# Patient Record
Sex: Male | Born: 1975 | Race: Black or African American | Hispanic: No
Health system: Midwestern US, Community
[De-identification: ages and names within clinical notes are randomized; demographics above are authoritative.]

## PROBLEM LIST (undated history)

## (undated) DIAGNOSIS — G8929 Other chronic pain: Secondary | ICD-10-CM

## (undated) DIAGNOSIS — M545 Low back pain: Secondary | ICD-10-CM

## (undated) DIAGNOSIS — F322 Major depressive disorder, single episode, severe without psychotic features: Secondary | ICD-10-CM

## (undated) DIAGNOSIS — I10 Essential (primary) hypertension: Secondary | ICD-10-CM

## (undated) DIAGNOSIS — M79604 Pain in right leg: Secondary | ICD-10-CM

## (undated) DIAGNOSIS — F419 Anxiety disorder, unspecified: Secondary | ICD-10-CM

## (undated) DIAGNOSIS — M79605 Pain in left leg: Secondary | ICD-10-CM

## (undated) DIAGNOSIS — G4700 Insomnia, unspecified: Secondary | ICD-10-CM

## (undated) DIAGNOSIS — M549 Dorsalgia, unspecified: Secondary | ICD-10-CM

## (undated) DIAGNOSIS — M1990 Unspecified osteoarthritis, unspecified site: Secondary | ICD-10-CM

## (undated) HISTORY — DX: Essential (primary) hypertension: I10

## (undated) HISTORY — PX: KNEE SURGERY: SHX244

## (undated) HISTORY — DX: Dorsalgia, unspecified: M54.9

## (undated) HISTORY — DX: Major depressive disorder, single episode, severe without psychotic features: F32.2

## (undated) HISTORY — DX: Other chronic pain: G89.29

## (undated) HISTORY — DX: Insomnia, unspecified: G47.00

## (undated) HISTORY — PX: BACK SURGERY: SHX140

## (undated) HISTORY — DX: Anxiety disorder, unspecified: F41.9

---

## 2016-04-04 ENCOUNTER — Encounter

## 2016-04-04 ENCOUNTER — Inpatient Hospital Stay: Admit: 2016-04-04 | Payer: MEDICARE | Primary: Family Medicine

## 2016-04-04 ENCOUNTER — Ambulatory Visit: Payer: MEDICARE | Attending: Neurological Surgery | Primary: Family Medicine

## 2016-04-04 DIAGNOSIS — M5440 Lumbago with sciatica, unspecified side: Secondary | ICD-10-CM

## 2016-04-04 NOTE — Progress Notes (Unsigned)
Neurosurgery Consult     Subjective:              Justin Barker is a 40 y.o. {race/ethnicity:17218} {rt/lf/ambi dominance:15932::"right handed"} male who presents with       Patient Active Problem List    Diagnosis Date Noted   ??? Hypertension          Phys Other, MD       Past Medical History:   Diagnosis Date   ??? Hypertension            Past Surgical History:   Procedure Laterality Date   ??? HX LUMBAR LAMINECTOMY      x4          Allergies   Allergen Reactions   ??? Trazodone Unknown (comments)           Family History   Problem Relation Age of Onset   ??? Hypertension Mother    ??? Hypertension Father    ??? Diabetes Father    ??? Heart Disease Father         Current Outpatient Prescriptions   Medication Sig   ??? metoprolol succinate (TOPROL-XL) 25 mg XL tablet TK 1 T PO  QD   ??? HYDROcodone-acetaminophen (NORCO) 10-325 mg tablet Take 1 Tab by mouth.   ??? diazePAM (VALIUM) 10 mg tablet Take 10 mg by mouth every six (6) hours as needed for Anxiety.     No current facility-administered medications for this visit.          Review of Systems: {Ros - complete:30496}      Objective:     BP 136/90   Temp 97.9 ??F (36.6 ??C)   Ht 5\' 11"  (1.803 m)   Wt 310 lb (140.6 kg)   BMI 43.24 kg/m2    Physical Exam:   General:  Alert, cooperative, no distress, appears stated age.   Eyes:  Conjunctivae/corneas clear. PERRL, EOMs intact. Fundi benign   Ears:  Normal TMs and external ear canals both ears.   Nose: Nares normal. Septum midline. Mucosa normal. No drainage or sinus tenderness.   Mouth/Throat: Lips, mucosa, and tongue normal. Teeth and gums normal.   Neck: Supple, symmetrical, trachea midline, no adenopathy, thyroid: no enlargment/tenderness/nodules, no carotid bruit and no JVD.   Back:   Symmetric, no curvature. ROM normal. No CVA tenderness.   Lungs:   Clear to auscultation bilaterally.   Heart:  Regular rate and rhythm, S1, S2 normal, no murmur, click, rub or gallop.   Abdomen:   Soft, non-tender. Bowel sounds normal. No masses,  No  organomegaly.   Extremities: Extremities normal, atraumatic, no cyanosis or edema.   Pulses: 2+ and symmetric all extremities.   Skin: Skin color, texture, turgor normal. No rashes or lesions   Lymph nodes: Cervical, supraclavicular, and axillary nodes normal.   Appearance:  The patient is well developed, well nourished, provides a coherent history and is in no acute distress.        GCS15, A&Ox3. Speech is fluent and appropriate.   Cranial Nerves:   Intact visual fields. Fundi are benign. PERLA, EOM's full, no nystagmus, no ptosis. Facial sensation is normal. Corneal reflexes are intact. Facial movement is symmetric. Hearing is normal bilaterally. Palate is midline with normal sternocleidomastoid and trapezius muscles are normal. Tongue is midline.   Motor:  5/5 strength in upper and lower proximal and distal muscles. Normal bulk and tone. No fasciculations.   Reflexes:   Deep tendon reflexes 2+/4 and symmetrical.  Hoffman's sign is negative bilaterally.  There is no clonus present.   Sensory:   Normal to touch, pinprick and vibration.   Gait:  Normal gait.   Tremor:   No tremor noted.   Cerebellar:  No cerebellar signs present.  Romberg's sign is negative.  Tandem gait is normal.       Assessment:     ***    Plan:         I have spent more that 50% of this *** minute visit discussing extensively with the patient various treatment options, alternative treatments, diagnostic studies and explained the findings of x-rays, MRI's, CT scans and other data such as EMG's prior notes, operative reports, etc.    {No Diagnosis Found}        A user error has taken place: encounter opened in error, closed for administrative reasons.

## 2016-06-01 ENCOUNTER — Other Ambulatory Visit: Payer: Self-pay | Admitting: Orthopedic Surgery

## 2016-06-03 ENCOUNTER — Encounter: Payer: Self-pay | Admitting: Vascular Surgery

## 2016-06-07 ENCOUNTER — Encounter: Attending: Neurological Surgery | Primary: Family Medicine

## 2016-06-07 ENCOUNTER — Encounter: Payer: Self-pay | Admitting: Vascular Surgery

## 2016-06-08 ENCOUNTER — Other Ambulatory Visit: Payer: Self-pay

## 2016-06-15 ENCOUNTER — Encounter: Payer: Medicare HMO | Admitting: Vascular Surgery

## 2016-06-20 NOTE — Pre-Procedure Instructions (Addendum)
     Karma GreaserJarvis Decaire  06/20/2016      [facility] [place] [address] [place] [address] Phone: [phone] Fax: [phone]    Your procedure is scheduled on Wednesday, June 29, 2016   [facility] Admitting at 6:30 AM             (posted surgery time 8:30 am - 1:35 pm)   Call this number if you have problems [facility]:  [phone], or during the weekdays, from 8-4:30 pm [phone]   Remember:  Do not eat food or drink liquids after midnight Tuesday June 28, 2016   Take these medicines the morning of surgery with A SIP OF WATER : Amlodipine, Clonidine, Metoprolol,  if needed: Hydrocodone for pain   Stop taking Aspirin, vitamins, fish oil, and herbal medications. Do not take any NSAIDs ie: Ibuprofen, Advil, Naproxen, BC and Goody Powder or any medication containing Aspirin;stop Wednesday Jun 22, 2016  Do not wear jewelry - no rings or watches.  Do not wear lotions, colognes or deoderant.             Men may shave face and neck.   Do not bring valuables to the hospital.  [facility] is not [facility] for any belongings or valuables.  Contacts, dentures or bridgework may not be worn into surgery.  Leave your suitcase in the car.  After surgery it may be brought to your room. For patients admitted to the hospital, discharge time will be determined by your treatment team. Please read over the following fact sheets that you were given. Pain Booklet, Coughing and Deep Breathing, Blood Transfusion Information, MRSA Information and Surgical Site Infection Prevention

## 2016-06-21 ENCOUNTER — Encounter (HOSPITAL_COMMUNITY)
Admission: RE | Admit: 2016-06-21 | Discharge: 2016-06-21 | Disposition: A | Discharge status: Home / Self Care | Payer: Medicare HMO | Source: Ambulatory Visit | Attending: Orthopedic Surgery | Admitting: Orthopedic Surgery

## 2016-06-21 ENCOUNTER — Encounter: Payer: Self-pay | Admitting: Vascular Surgery

## 2016-06-21 ENCOUNTER — Encounter (HOSPITAL_COMMUNITY): Payer: Self-pay

## 2016-06-21 DIAGNOSIS — Z01818 Encounter for other preprocedural examination: Secondary | ICD-10-CM | POA: Insufficient documentation

## 2016-06-21 HISTORY — DX: Unspecified osteoarthritis, unspecified site: M19.90

## 2016-06-21 HISTORY — DX: Pain in right leg: M79.604

## 2016-06-21 HISTORY — DX: Pain in left leg: M79.605

## 2016-06-21 LAB — ABO/RH: ABO/RH(D): A POS

## 2016-06-21 LAB — URINALYSIS, ROUTINE W REFLEX MICROSCOPIC
BILIRUBIN URINE: NEGATIVE
GLUCOSE, UA: NEGATIVE mg/dL
HGB URINE DIPSTICK: NEGATIVE
Ketones, ur: NEGATIVE mg/dL
Leukocytes, UA: NEGATIVE
Nitrite: NEGATIVE
PROTEIN: NEGATIVE mg/dL
SPECIFIC GRAVITY, URINE: 1.023 (ref 1.005–1.030)
pH: 5 (ref 5.0–8.0)

## 2016-06-21 LAB — CBC WITH DIFFERENTIAL/PLATELET
BASOS PCT: 0 %
Basophils Absolute: 0 10*3/uL (ref 0.0–0.1)
EOS ABS: 0.2 10*3/uL (ref 0.0–0.7)
EOS PCT: 3 %
HCT: 40.7 % (ref 39.0–52.0)
Hemoglobin: 13 g/dL (ref 13.0–17.0)
LYMPHS ABS: 3 10*3/uL (ref 0.7–4.0)
Lymphocytes Relative: 31 %
MCH: 27 pg (ref 26.0–34.0)
MCHC: 31.9 g/dL (ref 30.0–36.0)
MCV: 84.4 fL (ref 78.0–100.0)
MONO ABS: 0.6 10*3/uL (ref 0.1–1.0)
MONOS PCT: 6 %
NEUTROS PCT: 60 %
Neutro Abs: 5.9 10*3/uL (ref 1.7–7.7)
PLATELETS: 279 10*3/uL (ref 150–400)
RBC: 4.82 MIL/uL (ref 4.22–5.81)
RDW: 14.4 % (ref 11.5–15.5)
WBC: 9.7 10*3/uL (ref 4.0–10.5)

## 2016-06-21 LAB — COMPREHENSIVE METABOLIC PANEL
ALBUMIN: 3.8 g/dL (ref 3.5–5.0)
ALT: 18 U/L (ref 17–63)
ANION GAP: 7 (ref 5–15)
AST: 18 U/L (ref 15–41)
Alkaline Phosphatase: 51 U/L (ref 38–126)
BUN: 13 mg/dL (ref 6–20)
CALCIUM: 9.4 mg/dL (ref 8.9–10.3)
CO2: 27 mmol/L (ref 22–32)
Chloride: 105 mmol/L (ref 101–111)
Creatinine, Ser: 1.3 mg/dL — ABNORMAL HIGH (ref 0.61–1.24)
GFR calc non Af Amer: >60 mL/min (ref >60)
GLUCOSE: 95 mg/dL (ref 65–99)
Potassium: 4.3 mmol/L (ref 3.5–5.1)
SODIUM: 139 mmol/L (ref 135–145)
Total Bilirubin: 1.2 mg/dL (ref 0.3–1.2)
Total Protein: 7.9 g/dL (ref 6.5–8.1)

## 2016-06-21 LAB — SURGICAL PCR SCREEN
MRSA, PCR: NEGATIVE
Staphylococcus aureus: POSITIVE — AB

## 2016-06-21 LAB — TYPE AND SCREEN
ABO/RH(D): A POS
ANTIBODY SCREEN: NEGATIVE

## 2016-06-21 LAB — APTT: aPTT: 46 seconds — ABNORMAL HIGH (ref 24–36)

## 2016-06-21 LAB — PROTIME-INR
INR: 1.07
Prothrombin Time: 13.9 seconds (ref 11.4–15.2)

## 2016-06-21 NOTE — Progress Notes (Signed)
Pt stated that he did not take blood pressure medications as prescribed this morning. Edmonia CaprioAllison, Z., PA, Anesthesia, made aware of pt elevated BP, C/O pain and not taking BP medications this AM; pt educated on the importance of taking medications as prescribed, advised to take BP medications ASAP and to make sure that he takes them the morning of procedure. Pt verbalized understanding.

## 2016-06-21 NOTE — Progress Notes (Signed)
Pt denies SOB, chest pain, and being under the care of a cardiologist. Pt denies having a stress test, echo and cardiac cath. Pt denies having an EKG and chest x ray within the last year. Pt denies having recent labs. Pt chart forwardd to anesthesia to review abnormal PTT.

## 2016-06-22 NOTE — Progress Notes (Signed)
Anesthesia Chart Review:  Pt is a 41 year old male scheduled for L4-5, L5-S1 anterior lumbar interbody fusion on 06/29/2016 with Estill BambergMark Dumonski, MD and Gretta Beganodd Early, M.D.  - PCP is Caffie DammeFrank Moyer, MD (notes in care everywhere)  BP at PAT was 150/97 and 141/100 on recheck. Pt reports he did not take antihypertensive that morning. Patient instructed to take antihypertensive medication as prescribed.  PMH includes: HTN. Never smoker. BMI 43.5.  Medications include: Amlodipine, clonidine, metoprolol.  Preoperative labs reviewed.  PT normal, PTT 46.  - I notified Carla in Dr. Marshell Levanumonski's office of elevated PTT.   CXR 06/21/16: There is no active cardiopulmonary disease.  EKG 06/21/16: NSR  Rica Mastngela Jillianna Stanek, FNP-BC [facility] Phone: [phone] 06/22/2016 3:40 PM

## 2016-06-28 ENCOUNTER — Ambulatory Visit (INDEPENDENT_AMBULATORY_CARE_PROVIDER_SITE_OTHER): Payer: Medicare HMO | Admitting: Vascular Surgery

## 2016-06-28 ENCOUNTER — Encounter: Payer: Self-pay | Admitting: Vascular Surgery

## 2016-06-28 ENCOUNTER — Encounter: Payer: Medicare HMO | Admitting: Vascular Surgery

## 2016-06-28 VITALS — BP 129/88 | HR 75 | Temp 97.2°F | Resp 16 | Ht 71.0 in | Wt 314.0 lb

## 2016-06-28 DIAGNOSIS — M5137 Other intervertebral disc degeneration, lumbosacral region: Secondary | ICD-10-CM

## 2016-06-28 NOTE — Progress Notes (Signed)
[facility] [place]  Patient name: [name] MRN: [id number] DOB: 03-22-1976 Sex: male  REASON FOR CONSULT: Preop discussion for ALIF  HPI: [name] is a 41 y.o. male, who is seen today for discussion of the anterior exposure for disc fusion. Is very pleasant 41 year old gentleman with a history of 4 prior back surgeries. He has had recurrent disease and now is being considered for additional surgery. Has had recommendation for anterior interbody fusion of L4-5 and L5-S1. He is here today for discussion of my role in exposure. He has had no prior intra-abdominal surgery. Has no history of cardiac disease. He has severe symptoms of both back pain and leg discomfort most particularly left leg weakness.  Past Medical History:  Diagnosis Date  . Anxiety   . Arthritis   . Bilateral leg pain   . Chronic back pain   . Hypertension   . Insomnia   . Insomnia   . Severe major depression, single episode (HCC)     Family History  Problem Relation Age of Onset  . Hypertension Father   . Diabetes Father   . Hypertension Brother   . Diabetes Brother   . Hypertension Brother     SOCIAL HISTORY: Social History   Social History  . Marital status: Divorced    Spouse name: N/A  . Number of children: N/A  . Years of education: N/A   Occupational History  . Not on file.   Social History Main Topics  . Smoking status: Never Smoker  . Smokeless tobacco: Never Used  . Alcohol use No     Comment: rare  . Drug use: No  . Sexual activity: Not on file   Other Topics Concern  . Not on file   Social History Narrative  . No narrative on file    Allergies  Allergen Reactions  . Trazodone And Nefazodone Other (See Comments)    Prolong erection Prolonged erection  . Trazodone Other (See Comments)    Long lasting erection    Current Outpatient Prescriptions  Medication Sig Dispense Refill  . amLODipine (NORVASC)  10 MG tablet Take 10 mg by mouth daily.    . cloNIDine (CATAPRES) 0.1 MG tablet Take 1 tablet by mouth daily.    . diazepam (VALIUM) 10 MG tablet Take 10 mg by mouth at bedtime as needed for anxiety or sleep.    Marland Kitchen HYDROcodone-acetaminophen (NORCO/VICODIN) 5-325 MG tablet Take 1 tablet by mouth every 6 (six) hours as needed for moderate pain.    . metoprolol succinate (TOPROL-XL) 50 MG 24 hr tablet Take 50 mg by mouth daily. Take with or immediately following a meal.     No current facility-administered medications for this visit.     REVIEW OF SYSTEMS:  [X]  denotes positive finding, [ ]  denotes negative finding Cardiac  Comments:  Chest pain or chest pressure:    Shortness of breath upon exertion:    Short of breath when lying flat:    Irregular heart rhythm:        Vascular    Pain in calf, thigh, or hip brought on by ambulation: x   Pain in feet at night that wakes you up from your sleep:  x   Blood clot in your veins:    Leg swelling:         Pulmonary    Oxygen at home:    Productive cough:     Wheezing:  Neurologic    Sudden weakness in arms or legs:     Sudden numbness in arms or legs:  x   Sudden onset of difficulty speaking or slurred speech:    Temporary loss of vision in one eye:     Problems with dizziness:         Gastrointestinal    Blood in stool:     Vomited blood:         Genitourinary    Burning when urinating:     Blood in urine:        Psychiatric    Major depression:         Hematologic    Bleeding problems:    Problems with blood clotting too easily:        Skin    Rashes or ulcers:        Constitutional    Fever or chills:      PHYSICAL EXAM: Vitals:   06/28/16 1416  BP: 129/88  Pulse: 75  Resp: 16  Temp: 97.2 F (36.2 C)  SpO2: 100%  Weight: (!) 314 lb (142.4 kg)  Height: 5\' 11"  (1.803 m)    GENERAL: The patient is a well-nourished male, in no acute distress. The vital signs are documented above. CARDIOVASCULAR: 2+  radial and 2+ dorsalis pedis pulses bilaterally PULMONARY: There is good air exchange  ABDOMEN: Soft and non-tender . Obese. No masses noted MUSCULOSKELETAL: There are no major deformities or cyanosis. NEUROLOGIC: No focal weakness or paresthesias are detected. SKIN: There are no ulcers or rashes noted. PSYCHIATRIC: The patient has a normal affect.    MEDICAL ISSUES: Had long discussion with the patient. I explained that Dr.Dumonski has recommended spine surgery for improvement of his symptoms. Also has recommended portion of his procedure be from anterior approach. I Splane my role in mobilization for repair. Explained that he would have a left paramedian incision over the 45 and 51 disc. Explain the mobilization of the intraperitoneal contents and left ureter. Also explained mobilization of the arterial and venous structures overlying the spine and potential injury for these. I did discuss the possibility of retrograde ejaculation following the procedure. Explained that he would not be able to have children if this occurred. He reports that he has 3 children is no interest in having more children. We will plan surgery as scheduled on 07/20/2016   Larina Earthlyodd F. Early, MD [facility] Office Tel [phone] Pager [phone]

## 2016-07-19 ENCOUNTER — Encounter (HOSPITAL_COMMUNITY): Payer: Self-pay | Admitting: *Deleted

## 2016-07-19 MED ORDER — DEXTROSE 5 % IV SOLN
3.0000 g | INTRAVENOUS | Status: DC
  Filled 2016-07-19: qty 3000

## 2016-07-19 MED ORDER — DEXTROSE 5 % IV SOLN
3.0000 g | INTRAVENOUS | Status: AC
  Administered 2016-07-20 (×2): 3 g via INTRAVENOUS
  Filled 2016-07-19: qty 3000

## 2016-07-19 NOTE — Progress Notes (Signed)
Pt stated that there were no changes in history or medications since PAT appointment. Pt denies SOB and chest pain. LVM with Albin Fellingarla, Surgical Coordinator, regarding new orders. Pt verbalized understanding of all pre-op instructions.

## 2016-07-19 NOTE — H&P (Signed)
     PREOPERATIVE H&P  Chief Complaint: Low back pain, bilateral leg pain (L > R)  HPI: [name] is a 41 y.o. male who presents with ongoing pain in the back and bilateral legs x 6 months. Patient is s/p 4 previous lumbar procedures spanning 2012-2015.  MRI reveals spinal stenosis spanning L4-S1, also notable for a previous decompression spanning these levels.  Patient has failed multiple forms of conservative care and continues to have pain (see office notes for additional details regarding the patient's full course of treatment)  Past Medical History:  Diagnosis Date  . Anxiety   . Arthritis   . Bilateral leg pain   . Chronic back pain   . Hypertension   . Insomnia   . Insomnia   . Severe major depression, single episode [facility](HCC)    Past Surgical History:  Procedure Laterality Date  . BACK SURGERY    . KNEE SURGERY Right    Social History   Social History  . Marital status: Divorced    Spouse name: N/A  . Number of children: N/A  . Years of education: N/A   Social History Main Topics  . Smoking status: Never Smoker  . Smokeless tobacco: Never Used  . Alcohol use No     Comment: rare  . Drug use: No  . Sexual activity: Not on file   Other Topics Concern  . Not on file   Social History Narrative  . No narrative on file   Family History  Problem Relation Age of Onset  . Hypertension Father   . Diabetes Father   . Hypertension Brother   . Diabetes Brother   . Hypertension Brother    Allergies  Allergen Reactions  . Trazodone And Nefazodone Other (See Comments)    PROLONGED ERECTION   Prior to Admission medications   Medication Sig Start Date End Date Taking? Authorizing Provider  amLODipine (NORVASC) 10 MG tablet Take 10 mg by mouth daily.   Yes Historical Provider, MD  cloNIDine (CATAPRES) 0.1 MG tablet Take 1 tablet by mouth daily. 04/19/16  Yes Historical Provider, MD  diazepam (VALIUM) 10 MG tablet Take 10 mg by mouth at bedtime as needed for  anxiety or sleep.   Yes Historical Provider, MD  HYDROcodone-acetaminophen (NORCO/VICODIN) 5-325 MG tablet Take 1 tablet by mouth every 6 (six) hours as needed for moderate pain.   Yes Historical Provider, MD  metoprolol succinate (TOPROL-XL) 50 MG 24 hr tablet Take 50 mg by mouth daily. Take with or immediately following a meal.   Yes Historical Provider, MD     All other systems have been reviewed and were otherwise negative with the exception of those mentioned in the HPI and as above.  Physical Exam: There were no vitals filed for this visit.  General: Alert, no acute distress Cardiovascular: No pedal edema Respiratory: No cyanosis, no use of accessory musculature Skin: No lesions in the area of chief complaint Neurologic: Sensation intact distally Psychiatric: Patient is competent for consent with normal mood and affect Lymphatic: No axillary or cervical lymphadenopathy   Assessment/Plan: Bilateral leg pain Plan for Procedure(s): ANTERIOR LUMBAR INTERBODY FUSION LUMBAR 4-5, LUMBAR 5 -SACRUM 1 with stage 2 tomorrow (PSF L4-S1 with instrumentation)    Emilee HeroUMONSKI,Masud Holub LEONARD, MD 07/19/2016 9:18 AM

## 2016-07-20 ENCOUNTER — Inpatient Hospital Stay (HOSPITAL_COMMUNITY)
Admission: RE | Admit: 2016-07-20 | Discharge: 2016-07-23 | DRG: 460 | Disposition: A | Discharge status: Home / Self Care | Payer: Medicare HMO | Source: Ambulatory Visit | Attending: Orthopedic Surgery | Admitting: Orthopedic Surgery

## 2016-07-20 ENCOUNTER — Encounter (HOSPITAL_COMMUNITY): Payer: Self-pay | Admitting: *Deleted

## 2016-07-20 ENCOUNTER — Inpatient Hospital Stay (HOSPITAL_COMMUNITY): Payer: Medicare HMO

## 2016-07-20 ENCOUNTER — Inpatient Hospital Stay (HOSPITAL_COMMUNITY): Payer: Medicare HMO | Admitting: Anesthesiology

## 2016-07-20 ENCOUNTER — Inpatient Hospital Stay: Payer: Self-pay

## 2016-07-20 ENCOUNTER — Encounter (HOSPITAL_COMMUNITY)
Admission: RE | Disposition: A | Discharge status: Home / Self Care | Payer: Self-pay | Source: Ambulatory Visit | Attending: Orthopedic Surgery

## 2016-07-20 ENCOUNTER — Inpatient Hospital Stay (HOSPITAL_COMMUNITY): Payer: Medicare HMO | Admitting: Vascular Surgery

## 2016-07-20 DIAGNOSIS — Z79891 Long term (current) use of opiate analgesic: Secondary | ICD-10-CM

## 2016-07-20 DIAGNOSIS — F419 Anxiety disorder, unspecified: Secondary | ICD-10-CM | POA: Diagnosis present

## 2016-07-20 DIAGNOSIS — M5136 Other intervertebral disc degeneration, lumbar region: Secondary | ICD-10-CM | POA: Diagnosis not present

## 2016-07-20 DIAGNOSIS — Z6841 Body Mass Index (BMI) 40.0 and over, adult: Secondary | ICD-10-CM

## 2016-07-20 DIAGNOSIS — I10 Essential (primary) hypertension: Secondary | ICD-10-CM | POA: Diagnosis present

## 2016-07-20 DIAGNOSIS — Z419 Encounter for procedure for purposes other than remedying health state, unspecified: Secondary | ICD-10-CM

## 2016-07-20 DIAGNOSIS — M5116 Intervertebral disc disorders with radiculopathy, lumbar region: Secondary | ICD-10-CM | POA: Diagnosis present

## 2016-07-20 DIAGNOSIS — M5410 Radiculopathy, site unspecified: Secondary | ICD-10-CM | POA: Diagnosis present

## 2016-07-20 DIAGNOSIS — Z79899 Other long term (current) drug therapy: Secondary | ICD-10-CM | POA: Diagnosis not present

## 2016-07-20 DIAGNOSIS — Z888 Allergy status to other drugs, medicaments and biological substances status: Secondary | ICD-10-CM

## 2016-07-20 DIAGNOSIS — M48061 Spinal stenosis, lumbar region without neurogenic claudication: Secondary | ICD-10-CM | POA: Diagnosis present

## 2016-07-20 DIAGNOSIS — Z8249 Family history of ischemic heart disease and other diseases of the circulatory system: Secondary | ICD-10-CM

## 2016-07-20 DIAGNOSIS — Z833 Family history of diabetes mellitus: Secondary | ICD-10-CM | POA: Diagnosis not present

## 2016-07-20 DIAGNOSIS — M5137 Other intervertebral disc degeneration, lumbosacral region: Secondary | ICD-10-CM | POA: Diagnosis not present

## 2016-07-20 HISTORY — PX: ANTERIOR LUMBAR FUSION: SHX1170

## 2016-07-20 HISTORY — PX: ABDOMINAL EXPOSURE: SHX5708

## 2016-07-20 LAB — PROTIME-INR
INR: 1.01
Prothrombin Time: 13.3 seconds (ref 11.4–15.2)

## 2016-07-20 LAB — URINALYSIS, ROUTINE W REFLEX MICROSCOPIC
BILIRUBIN URINE: NEGATIVE
GLUCOSE, UA: NEGATIVE mg/dL
Ketones, ur: NEGATIVE mg/dL
LEUKOCYTES UA: NEGATIVE
NITRITE: NEGATIVE
PH: 5.5 (ref 5.0–8.0)
Protein, ur: NEGATIVE mg/dL
SPECIFIC GRAVITY, URINE: 1.025 (ref 1.005–1.030)

## 2016-07-20 LAB — COMPREHENSIVE METABOLIC PANEL
ALK PHOS: 50 U/L (ref 38–126)
ALT: 16 U/L — AB (ref 17–63)
ANION GAP: 11 (ref 5–15)
AST: 17 U/L (ref 15–41)
Albumin: 3.8 g/dL (ref 3.5–5.0)
BILIRUBIN TOTAL: 0.6 mg/dL (ref 0.3–1.2)
BUN: 20 mg/dL (ref 6–20)
CALCIUM: 9.3 mg/dL (ref 8.9–10.3)
CO2: 23 mmol/L (ref 22–32)
CREATININE: 1.34 mg/dL — AB (ref 0.61–1.24)
Chloride: 106 mmol/L (ref 101–111)
GFR calc non Af Amer: >60 mL/min (ref >60)
Glucose, Bld: 99 mg/dL (ref 65–99)
Potassium: 4 mmol/L (ref 3.5–5.1)
SODIUM: 140 mmol/L (ref 135–145)
TOTAL PROTEIN: 7.5 g/dL (ref 6.5–8.1)

## 2016-07-20 LAB — CBC WITH DIFFERENTIAL/PLATELET
Basophils Absolute: 0 10*3/uL (ref 0.0–0.1)
Basophils Relative: 0 %
EOS ABS: 0.3 10*3/uL (ref 0.0–0.7)
Eosinophils Relative: 4 %
HEMATOCRIT: 39.3 % (ref 39.0–52.0)
HEMOGLOBIN: 12.7 g/dL — AB (ref 13.0–17.0)
LYMPHS ABS: 2.9 10*3/uL (ref 0.7–4.0)
LYMPHS PCT: 32 %
MCH: 27.2 pg (ref 26.0–34.0)
MCHC: 32.3 g/dL (ref 30.0–36.0)
MCV: 84.2 fL (ref 78.0–100.0)
MONOS PCT: 7 %
Monocytes Absolute: 0.7 10*3/uL (ref 0.1–1.0)
NEUTROS ABS: 5.1 10*3/uL (ref 1.7–7.7)
NEUTROS PCT: 57 %
Platelets: 257 10*3/uL (ref 150–400)
RBC: 4.67 MIL/uL (ref 4.22–5.81)
RDW: 14.5 % (ref 11.5–15.5)
WBC: 9 10*3/uL (ref 4.0–10.5)

## 2016-07-20 LAB — URINALYSIS, MICROSCOPIC (REFLEX)

## 2016-07-20 LAB — TYPE AND SCREEN
ABO/RH(D): A POS
ANTIBODY SCREEN: NEGATIVE

## 2016-07-20 LAB — APTT: aPTT: 43 seconds — ABNORMAL HIGH (ref 24–36)

## 2016-07-20 SURGERY — ANTERIOR LUMBAR FUSION 2 LEVELS
Anesthesia: General | Site: Abdomen

## 2016-07-20 MED ORDER — PHENOL 1.4 % MT LIQD: 1.0000 | OROMUCOSAL | Status: DC | PRN

## 2016-07-20 MED ORDER — EPINEPHRINE PF 1 MG/ML IJ SOLN
INTRAMUSCULAR | Status: AC
  Filled 2016-07-20: qty 1

## 2016-07-20 MED ORDER — ONDANSETRON HCL 4 MG/2ML IJ SOLN: 4.0000 mg | Freq: Four times a day (QID) | INTRAMUSCULAR | Status: DC | PRN

## 2016-07-20 MED ORDER — ONDANSETRON HCL 4 MG PO TABS: 4.0000 mg | ORAL_TABLET | Freq: Four times a day (QID) | ORAL | Status: DC | PRN

## 2016-07-20 MED ORDER — CLONIDINE HCL 0.1 MG PO TABS
0.1000 mg | ORAL_TABLET | Freq: Every day | ORAL | Status: DC
  Administered 2016-07-20 – 2016-07-23 (×3): 0.1 mg via ORAL
  Filled 2016-07-20 (×3): qty 1

## 2016-07-20 MED ORDER — SUCCINYLCHOLINE CHLORIDE 200 MG/10ML IV SOSY
PREFILLED_SYRINGE | INTRAVENOUS | Status: AC
  Filled 2016-07-20: qty 10

## 2016-07-20 MED ORDER — ROCURONIUM BROMIDE 50 MG/5ML IV SOSY
PREFILLED_SYRINGE | INTRAVENOUS | Status: AC
  Filled 2016-07-20: qty 10

## 2016-07-20 MED ORDER — SODIUM CHLORIDE 0.9% FLUSH
3.0000 mL | Freq: Two times a day (BID) | INTRAVENOUS | Status: DC
Start: 2016-07-20 — End: 2016-07-23
  Administered 2016-07-20 – 2016-07-23 (×5): 3 mL via INTRAVENOUS

## 2016-07-20 MED ORDER — FENTANYL CITRATE (PF) 250 MCG/5ML IJ SOLN
INTRAMUSCULAR | Status: AC
  Filled 2016-07-20: qty 5

## 2016-07-20 MED ORDER — VECURONIUM BROMIDE 10 MG IV SOLR
INTRAVENOUS | Status: DC | PRN
  Administered 2016-07-20: 1 mg via INTRAVENOUS
  Administered 2016-07-20: 2 mg via INTRAVENOUS
  Administered 2016-07-20: 1 mg via INTRAVENOUS
  Administered 2016-07-20: 2 mg via INTRAVENOUS
  Administered 2016-07-20 (×2): 1 mg via INTRAVENOUS

## 2016-07-20 MED ORDER — MORPHINE SULFATE (PF) 2 MG/ML IV SOLN
1.0000 mg | INTRAVENOUS | Status: DC | PRN
  Administered 2016-07-20 – 2016-07-22 (×8): 2 mg via INTRAVENOUS
  Filled 2016-07-20 (×8): qty 1

## 2016-07-20 MED ORDER — MIDAZOLAM HCL 5 MG/5ML IJ SOLN
INTRAMUSCULAR | Status: DC | PRN
  Administered 2016-07-20: 2 mg via INTRAVENOUS

## 2016-07-20 MED ORDER — ONDANSETRON HCL 4 MG/2ML IJ SOLN
INTRAMUSCULAR | Status: DC | PRN
  Administered 2016-07-20: 4 mg via INTRAVENOUS

## 2016-07-20 MED ORDER — PANTOPRAZOLE SODIUM 40 MG IV SOLR
40.0000 mg | Freq: Every day | INTRAVENOUS | Status: DC
  Administered 2016-07-20: 40 mg via INTRAVENOUS
  Filled 2016-07-20: qty 40

## 2016-07-20 MED ORDER — SENNOSIDES-DOCUSATE SODIUM 8.6-50 MG PO TABS: 1.0000 | ORAL_TABLET | Freq: Every evening | ORAL | Status: DC | PRN

## 2016-07-20 MED ORDER — METOPROLOL SUCCINATE ER 25 MG PO TB24
50.0000 mg | ORAL_TABLET | Freq: Every day | ORAL | Status: DC
  Administered 2016-07-22 – 2016-07-23 (×2): 50 mg via ORAL
  Filled 2016-07-20 (×2): qty 2

## 2016-07-20 MED ORDER — ACETAMINOPHEN 650 MG RE SUPP: 650.0000 mg | RECTAL | Status: DC | PRN

## 2016-07-20 MED ORDER — PHENYLEPHRINE 40 MCG/ML (10ML) SYRINGE FOR IV PUSH (FOR BLOOD PRESSURE SUPPORT)
PREFILLED_SYRINGE | INTRAVENOUS | Status: AC
  Filled 2016-07-20: qty 10

## 2016-07-20 MED ORDER — DIAZEPAM 5 MG PO TABS
ORAL_TABLET | ORAL | Status: AC
  Administered 2016-07-20: 16:00:00
  Filled 2016-07-20: qty 1

## 2016-07-20 MED ORDER — LACTATED RINGERS IV SOLN
INTRAVENOUS | Status: DC | PRN
  Administered 2016-07-20 (×2): via INTRAVENOUS

## 2016-07-20 MED ORDER — SUCCINYLCHOLINE CHLORIDE 20 MG/ML IJ SOLN
INTRAMUSCULAR | Status: DC | PRN
  Administered 2016-07-20: 120 mg via INTRAVENOUS

## 2016-07-20 MED ORDER — HYDROMORPHONE HCL 1 MG/ML IJ SOLN
INTRAMUSCULAR | Status: AC
  Filled 2016-07-20: qty 1

## 2016-07-20 MED ORDER — AMLODIPINE BESYLATE 10 MG PO TABS
10.0000 mg | ORAL_TABLET | Freq: Every day | ORAL | Status: DC
  Administered 2016-07-22 – 2016-07-23 (×2): 10 mg via ORAL
  Filled 2016-07-20 (×2): qty 1

## 2016-07-20 MED ORDER — BISACODYL 5 MG PO TBEC
5.0000 mg | DELAYED_RELEASE_TABLET | Freq: Every day | ORAL | Status: DC | PRN
Start: 2016-07-20 — End: 2016-07-23
  Administered 2016-07-21 – 2016-07-22 (×2): 5 mg via ORAL
  Filled 2016-07-20 (×2): qty 1

## 2016-07-20 MED ORDER — PROMETHAZINE HCL 25 MG/ML IJ SOLN: 6.2500 mg | INTRAMUSCULAR | Status: DC | PRN

## 2016-07-20 MED ORDER — ACETAMINOPHEN 325 MG PO TABS: 650.0000 mg | ORAL_TABLET | ORAL | Status: DC | PRN

## 2016-07-20 MED ORDER — SODIUM CHLORIDE 0.9 % IV SOLN
250.0000 mL | INTRAVENOUS | Status: DC
  Administered 2016-07-20: 250 mL via INTRAVENOUS

## 2016-07-20 MED ORDER — FENTANYL CITRATE (PF) 250 MCG/5ML IJ SOLN
INTRAMUSCULAR | Status: AC
  Filled 2016-07-20: qty 10

## 2016-07-20 MED ORDER — CHLORHEXIDINE GLUCONATE 4 % EX LIQD
60.0000 mL | Freq: Once | CUTANEOUS | Status: DC
Start: 2016-07-21 — End: 2016-07-20

## 2016-07-20 MED ORDER — HYDROMORPHONE HCL 1 MG/ML IJ SOLN
INTRAMUSCULAR | Status: AC
  Administered 2016-07-20: 16:00:00
  Filled 2016-07-20: qty 1

## 2016-07-20 MED ORDER — CEFAZOLIN SODIUM 1 G IJ SOLR
INTRAMUSCULAR | Status: AC
  Filled 2016-07-20: qty 30

## 2016-07-20 MED ORDER — BUPIVACAINE HCL (PF) 0.25 % IJ SOLN
INTRAMUSCULAR | Status: AC
  Filled 2016-07-20: qty 30

## 2016-07-20 MED ORDER — POTASSIUM CHLORIDE IN NACL 20-0.9 MEQ/L-% IV SOLN
INTRAVENOUS | Status: DC
  Filled 2016-07-20: qty 1000

## 2016-07-20 MED ORDER — ALUM & MAG HYDROXIDE-SIMETH 200-200-20 MG/5ML PO SUSP
30.0000 mL | Freq: Four times a day (QID) | ORAL | Status: DC | PRN
  Administered 2016-07-21 – 2016-07-23 (×2): 30 mL via ORAL
  Filled 2016-07-20 (×2): qty 30

## 2016-07-20 MED ORDER — ONDANSETRON HCL 4 MG/2ML IJ SOLN
INTRAMUSCULAR | Status: AC
  Filled 2016-07-20: qty 2

## 2016-07-20 MED ORDER — HYDROMORPHONE HCL 1 MG/ML IJ SOLN
0.2500 mg | INTRAMUSCULAR | Status: DC | PRN
  Administered 2016-07-20 (×3): 0.5 mg via INTRAVENOUS

## 2016-07-20 MED ORDER — SODIUM CHLORIDE 0.9% FLUSH
3.0000 mL | INTRAVENOUS | Status: DC | PRN
  Administered 2016-07-21: 3 mL via INTRAVENOUS
  Filled 2016-07-20: qty 3

## 2016-07-20 MED ORDER — OXYCODONE-ACETAMINOPHEN 5-325 MG PO TABS
ORAL_TABLET | ORAL | Status: AC
  Administered 2016-07-20: 16:00:00
  Filled 2016-07-20: qty 2

## 2016-07-20 MED ORDER — PHENYLEPHRINE HCL 10 MG/ML IJ SOLN
INTRAMUSCULAR | Status: AC
  Filled 2016-07-20: qty 2

## 2016-07-20 MED ORDER — DIAZEPAM 5 MG PO TABS
5.0000 mg | ORAL_TABLET | Freq: Four times a day (QID) | ORAL | Status: DC | PRN
  Administered 2016-07-20: 5 mg via ORAL
  Administered 2016-07-20 – 2016-07-21 (×2): 10 mg via ORAL
  Administered 2016-07-22 (×2): 5 mg via ORAL
  Administered 2016-07-22 – 2016-07-23 (×2): 10 mg via ORAL
  Filled 2016-07-20 (×3): qty 2
  Filled 2016-07-20: qty 1
  Filled 2016-07-20: qty 2
  Filled 2016-07-20: qty 1

## 2016-07-20 MED ORDER — GELATIN ABSORBABLE MT POWD
OROMUCOSAL | Status: DC | PRN
  Administered 2016-07-20: 09:00:00 via TOPICAL

## 2016-07-20 MED ORDER — PROPOFOL 10 MG/ML IV BOLUS
INTRAVENOUS | Status: AC
  Filled 2016-07-20: qty 20

## 2016-07-20 MED ORDER — MENTHOL 3 MG MT LOZG: 1.0000 | LOZENGE | OROMUCOSAL | Status: DC | PRN

## 2016-07-20 MED ORDER — ZOLPIDEM TARTRATE 5 MG PO TABS
5.0000 mg | ORAL_TABLET | Freq: Every evening | ORAL | Status: DC | PRN
  Administered 2016-07-22: 5 mg via ORAL
  Filled 2016-07-20: qty 1

## 2016-07-20 MED ORDER — BUPIVACAINE LIPOSOME 1.3 % IJ SUSP
20.0000 mL | INTRAMUSCULAR | Status: DC
  Filled 2016-07-20: qty 20

## 2016-07-20 MED ORDER — PHENYLEPHRINE HCL 10 MG/ML IJ SOLN
INTRAVENOUS | Status: DC | PRN
  Administered 2016-07-20: 25 ug/min via INTRAVENOUS

## 2016-07-20 MED ORDER — LIDOCAINE 2% (20 MG/ML) 5 ML SYRINGE
INTRAMUSCULAR | Status: AC
  Filled 2016-07-20: qty 5

## 2016-07-20 MED ORDER — LIDOCAINE HCL (CARDIAC) 20 MG/ML IV SOLN
INTRAVENOUS | Status: DC | PRN
  Administered 2016-07-20: 100 mg via INTRAVENOUS

## 2016-07-20 MED ORDER — ROCURONIUM BROMIDE 100 MG/10ML IV SOLN
INTRAVENOUS | Status: DC | PRN
  Administered 2016-07-20 (×2): 50 mg via INTRAVENOUS

## 2016-07-20 MED ORDER — PROPOFOL 10 MG/ML IV BOLUS
INTRAVENOUS | Status: DC | PRN
  Administered 2016-07-20: 80 mg via INTRAVENOUS
  Administered 2016-07-20: 40 mg via INTRAVENOUS
  Administered 2016-07-20: 280 mg via INTRAVENOUS

## 2016-07-20 MED ORDER — ALBUMIN HUMAN 5 % IV SOLN
INTRAVENOUS | Status: DC | PRN
  Administered 2016-07-20 (×2): via INTRAVENOUS

## 2016-07-20 MED ORDER — SUGAMMADEX SODIUM 500 MG/5ML IV SOLN
INTRAVENOUS | Status: AC
  Filled 2016-07-20: qty 5

## 2016-07-20 MED ORDER — CHLORHEXIDINE GLUCONATE 4 % EX LIQD: 60.0000 mL | Freq: Once | CUTANEOUS | Status: DC

## 2016-07-20 MED ORDER — FLEET ENEMA 7-19 GM/118ML RE ENEM: 1.0000 | ENEMA | Freq: Once | RECTAL | Status: DC | PRN

## 2016-07-20 MED ORDER — FENTANYL CITRATE (PF) 100 MCG/2ML IJ SOLN
INTRAMUSCULAR | Status: DC | PRN
  Administered 2016-07-20: 100 ug via INTRAVENOUS
  Administered 2016-07-20 (×9): 50 ug via INTRAVENOUS

## 2016-07-20 MED ORDER — 0.9 % SODIUM CHLORIDE (POUR BTL) OPTIME
TOPICAL | Status: DC | PRN
  Administered 2016-07-20: 1000 mL

## 2016-07-20 MED ORDER — DOCUSATE SODIUM 100 MG PO CAPS
100.0000 mg | ORAL_CAPSULE | Freq: Two times a day (BID) | ORAL | Status: DC
  Administered 2016-07-20 – 2016-07-23 (×5): 100 mg via ORAL
  Filled 2016-07-20 (×5): qty 1

## 2016-07-20 MED ORDER — CEFAZOLIN SODIUM-DEXTROSE 2-4 GM/100ML-% IV SOLN
2.0000 g | Freq: Three times a day (TID) | INTRAVENOUS | Status: AC
  Administered 2016-07-20 – 2016-07-21 (×2): 2 g via INTRAVENOUS
  Filled 2016-07-20 (×2): qty 100

## 2016-07-20 MED ORDER — PHENYLEPHRINE HCL 10 MG/ML IJ SOLN
INTRAMUSCULAR | Status: DC | PRN
  Administered 2016-07-20 (×2): 80 ug via INTRAVENOUS
  Administered 2016-07-20: 120 ug via INTRAVENOUS

## 2016-07-20 MED ORDER — THROMBIN 20000 UNITS EX SOLR
CUTANEOUS | Status: AC
  Filled 2016-07-20: qty 20000

## 2016-07-20 MED ORDER — LACTATED RINGERS IV SOLN
INTRAVENOUS | Status: DC | PRN
  Administered 2016-07-20 (×3): via INTRAVENOUS

## 2016-07-20 MED ORDER — PROPOFOL 10 MG/ML IV BOLUS
INTRAVENOUS | Status: AC
  Filled 2016-07-20: qty 40

## 2016-07-20 MED ORDER — SUGAMMADEX SODIUM 500 MG/5ML IV SOLN
INTRAVENOUS | Status: DC | PRN
  Administered 2016-07-20: 300 mg via INTRAVENOUS

## 2016-07-20 MED ORDER — CEFAZOLIN SODIUM 10 G IJ SOLR
3.0000 g | INTRAMUSCULAR | Status: DC
  Filled 2016-07-20: qty 3000

## 2016-07-20 MED ORDER — MIDAZOLAM HCL 2 MG/2ML IJ SOLN
INTRAMUSCULAR | Status: AC
  Filled 2016-07-20: qty 2

## 2016-07-20 MED ORDER — POVIDONE-IODINE 7.5 % EX SOLN
Freq: Once | CUTANEOUS | Status: DC
  Filled 2016-07-20: qty 118

## 2016-07-20 MED ORDER — OXYCODONE-ACETAMINOPHEN 5-325 MG PO TABS
1.0000 | ORAL_TABLET | ORAL | Status: DC | PRN
  Administered 2016-07-20 – 2016-07-23 (×13): 2 via ORAL
  Filled 2016-07-20 (×12): qty 2

## 2016-07-20 MED ORDER — VECURONIUM BROMIDE 10 MG IV SOLR
INTRAVENOUS | Status: AC
  Filled 2016-07-20: qty 10

## 2016-07-20 SURGICAL SUPPLY — 86 items
APPLIER CLIP 11 MED OPEN (CLIP) ×2
BENZOIN TINCTURE PRP APPL 2/3 (GAUZE/BANDAGES/DRESSINGS) ×2 IMPLANT
BLADE CLIPPER SURG (BLADE) ×2 IMPLANT
BLADE SURG 10 STRL SS (BLADE) ×2 IMPLANT
BONE VIVIGEN FORMABLE 10CC (Bone Implant) ×4 IMPLANT
CAGE COUGAR MED 14X5 (Cage) ×2 IMPLANT
CAGE LUMBAR COUGAR LG 12 10 (Cage) ×2 IMPLANT
CLIP APPLIE 11 MED OPEN (CLIP) ×1 IMPLANT
CLIP LIGATING EXTRA MED SLVR (CLIP) IMPLANT
CLIP LIGATING EXTRA SM BLUE (MISCELLANEOUS) IMPLANT
CLSR STERI-STRIP ANTIMIC 1/2X4 (GAUZE/BANDAGES/DRESSINGS) ×2 IMPLANT
CORDS BIPOLAR (ELECTRODE) ×2 IMPLANT
COVER SURGICAL LIGHT HANDLE (MISCELLANEOUS) ×2 IMPLANT
DERMABOND ADVANCED (GAUZE/BANDAGES/DRESSINGS)
DERMABOND ADVANCED .7 DNX12 (GAUZE/BANDAGES/DRESSINGS) IMPLANT
DRAPE C-ARM 42X72 X-RAY (DRAPES) ×4 IMPLANT
DRAPE POUCH INSTRU U-SHP 10X18 (DRAPES) ×2 IMPLANT
DRAPE SURG 17X23 STRL (DRAPES) ×6 IMPLANT
DRSG MEPILEX BORDER 4X12 (GAUZE/BANDAGES/DRESSINGS) ×2 IMPLANT
DURAPREP 26ML APPLICATOR (WOUND CARE) ×2 IMPLANT
ELECT BLADE 4.0 EZ CLEAN MEGAD (MISCELLANEOUS) ×6
ELECT BLADE 6.5 EXT (BLADE) ×2 IMPLANT
ELECT CAUTERY BLADE 6.4 (BLADE) ×2 IMPLANT
ELECT REM PT RETURN 9FT ADLT (ELECTROSURGICAL) ×2
ELECTRODE BLDE 4.0 EZ CLN MEGD (MISCELLANEOUS) ×3 IMPLANT
ELECTRODE REM PT RTRN 9FT ADLT (ELECTROSURGICAL) ×1 IMPLANT
GAUZE SPONGE 4X4 12PLY STRL LF (GAUZE/BANDAGES/DRESSINGS) ×2 IMPLANT
GAUZE SPONGE 4X4 16PLY XRAY LF (GAUZE/BANDAGES/DRESSINGS) ×2 IMPLANT
GLOVE BIO SURGEON STRL SZ7 (GLOVE) ×2 IMPLANT
GLOVE BIO SURGEON STRL SZ8 (GLOVE) ×2 IMPLANT
GLOVE BIOGEL PI IND STRL 7.0 (GLOVE) ×1 IMPLANT
GLOVE BIOGEL PI IND STRL 8 (GLOVE) ×2 IMPLANT
GLOVE BIOGEL PI INDICATOR 7.0 (GLOVE) ×1
GLOVE BIOGEL PI INDICATOR 8 (GLOVE) ×2
GLOVE SS BIOGEL STRL SZ 7.5 (GLOVE) ×1 IMPLANT
GLOVE SUPERSENSE BIOGEL SZ 7.5 (GLOVE) ×1
GOWN STRL REUS W/ TWL LRG LVL3 (GOWN DISPOSABLE) ×3 IMPLANT
GOWN STRL REUS W/ TWL XL LVL3 (GOWN DISPOSABLE) ×1 IMPLANT
GOWN STRL REUS W/TWL LRG LVL3 (GOWN DISPOSABLE) ×3
GOWN STRL REUS W/TWL XL LVL3 (GOWN DISPOSABLE) ×1
HEMOSTAT SURGICEL 2X14 (HEMOSTASIS) IMPLANT
INSERT FOGARTY 61MM (MISCELLANEOUS) IMPLANT
INSERT FOGARTY SM (MISCELLANEOUS) IMPLANT
KIT BASIN OR (CUSTOM PROCEDURE TRAY) ×2 IMPLANT
KIT ROOM TURNOVER OR (KITS) ×2 IMPLANT
LOOP VESSEL MAXI BLUE (MISCELLANEOUS) IMPLANT
LOOP VESSEL MINI RED (MISCELLANEOUS) IMPLANT
NEEDLE HYPO 25GX1X1/2 BEV (NEEDLE) ×2 IMPLANT
NEEDLE SPNL 18GX3.5 QUINCKE PK (NEEDLE) ×6 IMPLANT
NS IRRIG 1000ML POUR BTL (IV SOLUTION) ×2 IMPLANT
PACK LAMINECTOMY ORTHO (CUSTOM PROCEDURE TRAY) ×2 IMPLANT
PACK UNIVERSAL I (CUSTOM PROCEDURE TRAY) ×2 IMPLANT
PAD ARMBOARD 7.5X6 YLW CONV (MISCELLANEOUS) ×8 IMPLANT
PATTIES SURGICAL .5 X1 (DISPOSABLE) ×2 IMPLANT
SPONGE INTESTINAL PEANUT (DISPOSABLE) ×4 IMPLANT
SPONGE LAP 18X18 X RAY DECT (DISPOSABLE) ×2 IMPLANT
SPONGE LAP 4X18 X RAY DECT (DISPOSABLE) ×2 IMPLANT
SPONGE SURGIFOAM ABS GEL 100 (HEMOSTASIS) ×4 IMPLANT
STAPLER VISISTAT 35W (STAPLE) IMPLANT
STRIP CLOSURE SKIN 1/2X4 (GAUZE/BANDAGES/DRESSINGS) ×2 IMPLANT
SURGIFLO W/THROMBIN 8M KIT (HEMOSTASIS) IMPLANT
SUT MNCRL AB 4-0 PS2 18 (SUTURE) ×2 IMPLANT
SUT PDS AB 1 CTX 36 (SUTURE) ×4 IMPLANT
SUT PROLENE 4 0 RB 1 (SUTURE)
SUT PROLENE 4-0 RB1 .5 CRCL 36 (SUTURE) IMPLANT
SUT PROLENE 5 0 C 1 24 (SUTURE) IMPLANT
SUT PROLENE 5 0 CC1 (SUTURE) IMPLANT
SUT PROLENE 6 0 C 1 30 (SUTURE) IMPLANT
SUT PROLENE 6 0 CC (SUTURE) IMPLANT
SUT SILK 0 TIES 10X30 (SUTURE) ×2 IMPLANT
SUT SILK 2 0 TIES 10X30 (SUTURE) ×4 IMPLANT
SUT SILK 2 0SH CR/8 30 (SUTURE) IMPLANT
SUT SILK 3 0 TIES 10X30 (SUTURE) ×4 IMPLANT
SUT SILK 3 0SH CR/8 30 (SUTURE) IMPLANT
SUT VIC AB 1 CT1 27 (SUTURE) ×2
SUT VIC AB 1 CT1 27XBRD ANBCTR (SUTURE) ×2 IMPLANT
SUT VIC AB 1 CTX 36 (SUTURE) ×2
SUT VIC AB 1 CTX36XBRD ANBCTR (SUTURE) ×2 IMPLANT
SUT VIC AB 2-0 CT2 18 VCP726D (SUTURE) ×2 IMPLANT
SYR BULB IRRIGATION 50ML (SYRINGE) ×2 IMPLANT
TAPE CLOTH SURG 6X10 WHT LF (GAUZE/BANDAGES/DRESSINGS) ×2 IMPLANT
TOWEL OR 17X24 6PK STRL BLUE (TOWEL DISPOSABLE) ×2 IMPLANT
TOWEL OR 17X26 10 PK STRL BLUE (TOWEL DISPOSABLE) ×2 IMPLANT
TRAY FOLEY CATH 16FR SILVER (SET/KITS/TRAYS/PACK) ×2 IMPLANT
WATER STERILE IRR 1000ML POUR (IV SOLUTION) ×2 IMPLANT
YANKAUER SUCT BULB TIP NO VENT (SUCTIONS) ×2 IMPLANT

## 2016-07-20 NOTE — OR Nursing (Signed)
Dr. Arbie CookeyEarly in at 1130 to reposition retractor.

## 2016-07-20 NOTE — Transfer of Care (Signed)
Immediate Anesthesia Transfer of Care Note  Patient: [name]  Procedure(s) Performed: Procedure(s) with comments: ANTERIOR LUMBAR INTERBODY FUSION LUMBAR 4-5, LUMBAR 5 -SACRUM 1 (N/A) - ANTERIOR LUMBAR INTERBODY FUSION LUMBAR 4-5, LUMBAR 5 - SACRUM 1 ABDOMINAL EXPOSURE (N/A)  Patient Location: PACU  Anesthesia Type:General Level of Consciousness: awake, alert , oriented and sedated  Airway & Oxygen Therapy: Patient Spontanous Breathing and Patient connected to nasal cannula oxygen  Post-op Assessment: Report given to RN, Post -op Vital signs reviewed and stable and Patient moving all extremities  Post vital signs: Reviewed and stable  Last Vitals:  Vitals:   07/20/16 0608 07/20/16 1443  BP: (!) 163/105 (!) 148/74  Pulse: 71 (!) 110  Resp: 20 16  Temp: 36.7 C 36.7 C    Last Pain:  Vitals:   07/20/16 0608  TempSrc: Oral  PainSc:       Patients Stated Pain Goal: 1 (07/20/16 0557)  Complications: No apparent anesthesia complications

## 2016-07-20 NOTE — H&P (Signed)
Patient underwent stage 1 of his 2-staged procedure yesterday, and tolerated the procedure well. He presents today for stage 2 of his 2-staged procedure. Will proceed as scheduled (PSF L4-S1 with instrumentation and allograft).

## 2016-07-20 NOTE — OR Nursing (Signed)
Dr. Arbie CookeyEarly left at 1145.

## 2016-07-20 NOTE — Op Note (Signed)
NAMEGLENWOOD, REVOIR NO.:  [id number]  MEDICAL RECORD NO.:  [id number]  LOCATION:  [facility]                         FACILITY:  [facility]  PHYSICIAN:  Estill Bamberg, MD      DATE OF BIRTH:  Feb 03, 1976  DATE OF PROCEDURE:  07/20/2016                              OPERATIVE REPORT   PREOPERATIVE DIAGNOSES: 1. Degenerative disc disease L4-5, L5-S1. 2. Left greater than right lumbar radiculopathy. 3. Spinal stenosis L4-5, L5-S1. 4. Status post 4 previous lumbar decompressive procedures involving L4-     5 and L5-S1.  POSTOPERATIVE DIAGNOSES: 1. Degenerative disc disease L4-5, L5-S1. 2. Left greater than right lumbar radiculopathy. 3. Spinal stenosis L4-5, L5-S1. 4. Status post 4 previous lumbar decompressive procedures involving L4-     5 and L5-S1.  PROCEDURE (STAGE 1 OF 2): 1. Anterior lumbar interbody fusion, L4-5, L5-S1. 2. Placement of anterior instrumentation, L4-5, L5-S1. 3. Insertion of interbody device x2 (Cougar intervertebral spacers, 12     mm with 10 degrees of lordosis at L5-S1, 14 mm with 5 degrees of     lordosis, L4-5). 4. Use of morselized allograft-ViviGen. 5. Intraoperative use of fluoroscopy.  SURGEON:  Estill Bamberg, MD  ASSISTANT:  Jason Coop, PA-C.  ANESTHESIA:  General endotracheal anesthesia.  COMPLICATIONS:  None.  DISPOSITION:  Stable.  ESTIMATED BLOOD LOSS:  Minimal.  INDICATIONS FOR SURGERY:  Briefly, Mr. Ehrman is a very pleasant 41- year-old male who did present to me with ongoing and severe pain in the left greater than right legs.  Of note, the patient is status post multiple procedures involving the L4-5 and L5-S1 levels.  His primary complaint was pain in his left leg, and he did state that this particular pain had been present for approximately 3 years.  He did see me as a second opinion, after his initial surgeon felt that an anterior/posterior procedure was indicated.  I did agree with those recommendations, and  the patient did elect to have surgery performed by me.  The patient did understand that the plan was to proceed with a two- staged procedure, with an anterior procedure being done today, to be followed by posterior procedure on the following day.  The patient was fully aware of the risks and limitations of surgery and did elect to proceed.  OPERATIVE DETAILS:  On July 20, 2016, the patient was brought to surgery and general endotracheal anesthesia was administered.  The patient was placed supine on a well-padded hospital bed.  The patient's arms were abducted 90 degrees and all bony prominences were meticulously padded.  The abdomen was then prepped and draped in the usual fashion. An anterior incision and an anterior retroperitoneal exposure were performed by Dr. Gretta Began.  I did function as his co-surgeon for the approach.  Initially, he did gain access to the L5-S1 intervertebral space.  He then scrubbed out of the case, and I then proceeded with a thorough and complete L5-S1 intervertebral diskectomy in the usual fashion.  I was very pleased with the diskectomy that I was able to accomplish and the preparation of the endplates.  I then trialed a series of intervertebral spacers and the appropriate size intervertebral spacer was then  packed with ViviGen and tamped into position.  I then cannulated the superior and anterior aspect of the S1 vertebral body.  I then placed a screw through the vertebral body with an anterior fixation device to help secure the intervertebral implant to the appropriate position.  I was pleased with the press-fit of the anterior fixation.  I was very pleased with the AP and lateral fluoroscopic images.  Dr. Tawanna Coolerodd Early then scrubbed back into the case and he and I did expose the L4-5 intervertebral space uneventfully.  He then scrubbed out of the case, and I proceeded with a thorough and complete L4-5 intervertebral diskectomy.  Once the diskectomy was  accomplished, I did spend a significant amount of time in order to tease away all intervertebral disk fragments that I was able to identify, particularly a disc fragments behind the L5 vertebral body, as there was noted to be compression in this particular region on the left side, as noted on the patient's preoperative MRI.  Given the patient's large body habitus and the location of the herniation, it was extremely meticulous and difficult to gain access to the mid aspect of the back of the L5 vertebral body, however, I was able to remove all intervertebral disk fragments that were able to be encountered with the exposure.  Again, I did meticulously use a nerve hook and various instruments in order to remove multiple disk fragments located medially behind the L5 vertebral body.  At the level of the intervertebral space, I was very pleased with the decompression.  I did feel that any additional attempts at trying to remove any additional disc fragments outside of my visual field bring with a substantially increased risk of either neurologic injury or cerebral spinal fluid leak.  Therefore, at this point, I did trial various intervertebral spacers, and the appropriate size intervertebral spacer was packed with ViviGen and tamped into position in the usual fashion.  I was very pleased with the press-fit of the spacer and I was very pleased with the final AP and lateral fluoroscopic images.  The wound was then copiously irrigated.  The fascia was then closed using 1 PDS.  The subcutaneous layer was closed using 0 Vicryl followed by 2-0 Vicryl and the skin was closed using 4-0 Monocryl.  Benzoin and Steri- Strips were applied followed by sterile dressing.  All instrument counts were correct at the termination of the procedure. Of note, Jason CoopKayla McKenzie was my assistant throughout surgery, and did aid in retraction, suctioning, and closure from start to finish.     Estill BambergMark Kylee Nardozzi,  MD     MD/MEDQ  D:  07/20/2016  T:  07/20/2016  Job:  [id number]

## 2016-07-20 NOTE — Op Note (Signed)
    OPERATIVE REPORT  DATE OF SURGERY: 07/20/2016  PATIENT: [name], 41 y.o. male MRN: [id number]  DOB: April 19, 1976  PRE-OPERATIVE DIAGNOSIS: Degenerative disc disease  POST-OPERATIVE DIAGNOSIS:  Same  PROCEDURE: Anterior exposure for L4-5 and L5-S1 disc surgery  SURGEON:  Gretta Beganodd Maize Brittingham, M.D.  Co-surgeon for the exposure Dr.Dumonski  ANESTHESIA:  Gen.  EBL: 300 ml  Total I/O In: 3700 [I.V.:3200; IV Piggyback:500] Out: 550 [Urine:250; Blood:300]  BLOOD ADMINISTERED: None  DRAINS: None  SPECIMEN: None  COUNTS CORRECT:  YES  PLAN OF CARE: PACU   PATIENT DISPOSITION:  PACU - hemodynamically stable  PROCEDURE DETAILS: Patient was taken to the operating room placed supine position where the area of the abdomen was prepped and draped in the usual sterile fashion. Lateral C-arm projection was used to mark the level of the L4-5 and L5-S1 disc. The location for the incision was marked and the left paramedian over these 2 disc levels. Incision was made over this and carried down through the subcutaneous fat with electrocautery. The fat was mobilized off the anterior rectus sheath in the right anterior rectus sheath was opened in line with the skin incision as well. The rectus muscle was mobilized circumferentially. The rest peritoneal space was entered bluntly in the left lower quadrant and the intraperitoneal contents were rotated to the right bluntly. The left ureter was mobilized to the right as well. Dissection over the sacrum was continued and the middle sacral vessels were divided with clips. Blunt dissection was continued to the right and left of the L5-S1 disc between the level of the iliac vessels. After adequate exposure was obtained the left iliac vessels and aorta were rotated to the right. Patient had a large lumbar vein that was ligated and divided and a lumbar artery which was also ligated and divided. Continued mobilization revealed the location of the iliolumbar veins.  There were 3 separate iliolumbar veins and these were all ligated 3-0 silk ties and divided. The Thompson retractor was brought onto the field and the reverse lip 150 blades were positioned to the right and left of the L5-S1 disc. The 190 blades were used for superior and inferior retraction. C-arm was brought back onto the field to confirm that this was the level of the L5-S1 disc. The disc surgery will be dictated as a separate note. After this was completed recent carotid and repositioned the [facility] for exposure to the L4-5 disc. Again the 150 reverse lip blades were positioned to the right and left of the disc and 190 malleable was used for superior exposure. The remainder of the operation will be dictated as a separate note Dr.Dumonski   [name], M.D., [facility] CenterFACS 07/20/2016 2:18 PM

## 2016-07-20 NOTE — Anesthesia Preprocedure Evaluation (Addendum)
Anesthesia Evaluation  Patient identified by MRN, date of birth, ID band Patient awake    Reviewed: Allergy & Precautions, NPO status , Patient's Chart, lab work & pertinent test results  History of Anesthesia Complications Negative for: history of anesthetic complications  Airway Mallampati: I  TM Distance: >3 FB Neck ROM: Full    Dental no notable dental hx. (+) Dental Advisory Given   Pulmonary neg pulmonary ROS,    Pulmonary exam normal        Cardiovascular hypertension, Normal cardiovascular exam     Neuro/Psych PSYCHIATRIC DISORDERS Anxiety Depression    GI/Hepatic negative GI ROS, Neg liver ROS,   Endo/Other  Morbid obesity  Renal/GU negative Renal ROS     Musculoskeletal  (+) Arthritis ,   Abdominal (+) + obese,   Peds  Hematology   Anesthesia Other Findings   Reproductive/Obstetrics                            Anesthesia Physical  Anesthesia Plan  ASA: III  Anesthesia Plan: General   Post-op Pain Management:    Induction: Intravenous  Airway Management Planned: Oral ETT  Additional Equipment:   Intra-op Plan:   Post-operative Plan: Extubation in OR  Informed Consent: I have reviewed the patients History and Physical, chart, labs and discussed the procedure including the risks, benefits and alternatives for the proposed anesthesia with the patient or authorized representative who has indicated his/her understanding and acceptance.   Dental advisory given  Plan Discussed with: CRNA, Anesthesiologist and Surgeon  Anesthesia Plan Comments:        Anesthesia Quick Evaluation

## 2016-07-20 NOTE — Anesthesia Preprocedure Evaluation (Addendum)
Anesthesia Evaluation  Patient identified by MRN, date of birth, ID band Patient awake    Reviewed: Allergy & Precautions, NPO status , Patient's Chart, lab work & pertinent test results  Airway Mallampati: I  TM Distance: >3 FB Neck ROM: Full    Dental  (+) Teeth Intact   Pulmonary neg pulmonary ROS,    breath sounds clear to auscultation       Cardiovascular hypertension,  Rhythm:Regular Rate:Normal     Neuro/Psych    GI/Hepatic   Endo/Other  Morbid obesity  Renal/GU      Musculoskeletal  (+) Arthritis ,   Abdominal (+) + obese,   Peds  Hematology   Anesthesia Other Findings   Reproductive/Obstetrics                            Anesthesia Physical Anesthesia Plan  ASA: III  Anesthesia Plan: General   Post-op Pain Management:    Induction: Intravenous  Airway Management Planned: Oral ETT  Additional Equipment:   Intra-op Plan:   Post-operative Plan: Extubation in OR  Informed Consent: I have reviewed the patients History and Physical, chart, labs and discussed the procedure including the risks, benefits and alternatives for the proposed anesthesia with the patient or authorized representative who has indicated his/her understanding and acceptance.   Dental advisory given  Plan Discussed with:   Anesthesia Plan Comments:         Anesthesia Quick Evaluation

## 2016-07-20 NOTE — Anesthesia Procedure Notes (Addendum)
Procedure Name: Intubation Date/Time: 07/20/2016 7:44 AM Performed by: Fransisca KaufmannMEYER, Allyn Bartelson E Pre-anesthesia Checklist: Patient identified, Emergency Drugs available, Suction available and Patient being monitored Patient Re-evaluated:Patient Re-evaluated prior to inductionOxygen Delivery Method: Circle System Utilized Preoxygenation: Pre-oxygenation with 100% oxygen Intubation Type: IV induction Ventilation: Mask ventilation without difficulty Laryngoscope Size: Miller and 3 Grade View: Grade II Tube type: Oral Tube size: 8.0 mm Number of attempts: 1 Airway Equipment and Method: Stylet Placement Confirmation: ETT inserted through vocal cords under direct vision,  positive ETCO2 and breath sounds checked- equal and bilateral Secured at: 23 (23) cm Tube secured with: Tape Dental Injury: Teeth and Oropharynx as per pre-operative assessment

## 2016-07-20 NOTE — Anesthesia Postprocedure Evaluation (Addendum)
Anesthesia Post Note  Patient: Karma GreaserJarvis Steinmetz  Procedure(s) Performed: Procedure(s) (LRB): ANTERIOR LUMBAR INTERBODY FUSION LUMBAR 4-5, LUMBAR 5 -SACRUM 1 (N/A) ABDOMINAL EXPOSURE (N/A)  Patient location during evaluation: PACU Anesthesia Type: General Level of consciousness: sedated, awake and oriented Pain management: pain level controlled Vital Signs Assessment: post-procedure vital signs reviewed and stable Respiratory status: spontaneous breathing, nonlabored ventilation, respiratory function stable and patient connected to nasal cannula oxygen Cardiovascular status: blood pressure returned to baseline and stable Postop Assessment: no signs of nausea or vomiting Anesthetic complications: no       Last Vitals:  Vitals:   07/20/16 1515 07/20/16 1530  BP: (!) 142/78 (!) 131/105  Pulse: (!) 111 (!) 115  Resp: 18 17  Temp:      Last Pain:  Vitals:   07/20/16 1540  TempSrc:   PainSc: Asleep                 Darwin Guastella,JAMES TERRILL

## 2016-07-21 ENCOUNTER — Inpatient Hospital Stay (HOSPITAL_COMMUNITY): Payer: Medicare HMO

## 2016-07-21 ENCOUNTER — Encounter (HOSPITAL_COMMUNITY)
Admission: RE | Disposition: A | Discharge status: Home / Self Care | Payer: Self-pay | Source: Ambulatory Visit | Attending: Orthopedic Surgery

## 2016-07-21 ENCOUNTER — Inpatient Hospital Stay (HOSPITAL_COMMUNITY): Admission: RE | Admit: 2016-07-21 | Payer: Medicare HMO | Source: Ambulatory Visit | Admitting: Orthopedic Surgery

## 2016-07-21 ENCOUNTER — Inpatient Hospital Stay (HOSPITAL_COMMUNITY): Payer: Medicare HMO | Admitting: Anesthesiology

## 2016-07-21 SURGERY — POSTERIOR LUMBAR FUSION 2 LEVEL
Anesthesia: General

## 2016-07-21 MED ORDER — HYDROMORPHONE HCL 1 MG/ML IJ SOLN
INTRAMUSCULAR | Status: AC
  Administered 2016-07-21: 0.5 mg via INTRAVENOUS
  Filled 2016-07-21: qty 1

## 2016-07-21 MED ORDER — BUPIVACAINE HCL (PF) 0.25 % IJ SOLN
INTRAMUSCULAR | Status: AC
  Filled 2016-07-21: qty 30

## 2016-07-21 MED ORDER — FENTANYL CITRATE (PF) 250 MCG/5ML IJ SOLN
INTRAMUSCULAR | Status: AC
  Filled 2016-07-21: qty 5

## 2016-07-21 MED ORDER — HYDROMORPHONE HCL 1 MG/ML IJ SOLN
0.2500 mg | INTRAMUSCULAR | Status: DC | PRN
  Administered 2016-07-21: 0.5 mg via INTRAVENOUS

## 2016-07-21 MED ORDER — KETOROLAC TROMETHAMINE (PF) 0.45 % OP SOLN: 1.0000 [drp] | OPHTHALMIC | Status: DC

## 2016-07-21 MED ORDER — FENTANYL CITRATE (PF) 100 MCG/2ML IJ SOLN
INTRAMUSCULAR | Status: DC | PRN
  Administered 2016-07-21 (×2): 50 ug via INTRAVENOUS
  Administered 2016-07-21: 100 ug via INTRAVENOUS
  Administered 2016-07-21: 50 ug via INTRAVENOUS

## 2016-07-21 MED ORDER — BUPIVACAINE-EPINEPHRINE 0.25% -1:200000 IJ SOLN
INTRAMUSCULAR | Status: DC | PRN
  Administered 2016-07-21: 30 mL

## 2016-07-21 MED ORDER — PHENYLEPHRINE HCL 10 MG/ML IJ SOLN
INTRAMUSCULAR | Status: AC
  Filled 2016-07-21: qty 2

## 2016-07-21 MED ORDER — BSS IO SOLN
30.0000 mL | Freq: Once | INTRAOCULAR | Status: DC
  Filled 2016-07-21: qty 30

## 2016-07-21 MED ORDER — LIDOCAINE 2% (20 MG/ML) 5 ML SYRINGE
INTRAMUSCULAR | Status: DC | PRN
  Administered 2016-07-21: 80 mg via INTRAVENOUS

## 2016-07-21 MED ORDER — BUPIVACAINE LIPOSOME 1.3 % IJ SUSP
20.0000 mL | INTRAMUSCULAR | Status: AC
  Administered 2016-07-21: 20 mL
  Filled 2016-07-21: qty 20

## 2016-07-21 MED ORDER — EPINEPHRINE PF 1 MG/ML IJ SOLN
INTRAMUSCULAR | Status: AC
  Filled 2016-07-21: qty 1

## 2016-07-21 MED ORDER — PHENYLEPHRINE 40 MCG/ML (10ML) SYRINGE FOR IV PUSH (FOR BLOOD PRESSURE SUPPORT)
PREFILLED_SYRINGE | INTRAVENOUS | Status: DC | PRN
  Administered 2016-07-21: 120 ug via INTRAVENOUS
  Administered 2016-07-21: 160 ug via INTRAVENOUS

## 2016-07-21 MED ORDER — ONDANSETRON HCL 4 MG/2ML IJ SOLN
INTRAMUSCULAR | Status: DC | PRN
  Administered 2016-07-21 (×2): 4 mg via INTRAVENOUS

## 2016-07-21 MED ORDER — 0.9 % SODIUM CHLORIDE (POUR BTL) OPTIME
TOPICAL | Status: DC | PRN
Start: 2016-07-21 — End: 2016-07-21
  Administered 2016-07-21: 1000 mL

## 2016-07-21 MED ORDER — MIDAZOLAM HCL 2 MG/2ML IJ SOLN
INTRAMUSCULAR | Status: AC
  Filled 2016-07-21: qty 4

## 2016-07-21 MED ORDER — PROPOFOL 10 MG/ML IV BOLUS
INTRAVENOUS | Status: AC
  Filled 2016-07-21: qty 40

## 2016-07-21 MED ORDER — KETAMINE HCL 10 MG/ML IJ SOLN
INTRAMUSCULAR | Status: DC | PRN
  Administered 2016-07-21: 40 mg via INTRAVENOUS
  Administered 2016-07-21: 60 mg via INTRAVENOUS

## 2016-07-21 MED ORDER — PROPOFOL 1000 MG/100ML IV EMUL
INTRAVENOUS | Status: AC
  Filled 2016-07-21: qty 100

## 2016-07-21 MED ORDER — DEXTROSE 5 % IV SOLN
INTRAVENOUS | Status: DC | PRN
  Administered 2016-07-21: 3 g via INTRAVENOUS

## 2016-07-21 MED ORDER — ONDANSETRON HCL 4 MG/2ML IJ SOLN
INTRAMUSCULAR | Status: AC
  Filled 2016-07-21: qty 4

## 2016-07-21 MED ORDER — PHENYLEPHRINE HCL 10 MG/ML IJ SOLN
INTRAVENOUS | Status: DC | PRN
  Administered 2016-07-21: 30 ug/min via INTRAVENOUS

## 2016-07-21 MED ORDER — PROPOFOL 500 MG/50ML IV EMUL
INTRAVENOUS | Status: DC | PRN
  Administered 2016-07-21: 10:00:00 via INTRAVENOUS
  Administered 2016-07-21: 50 ug/kg/min via INTRAVENOUS

## 2016-07-21 MED ORDER — THROMBIN 20000 UNITS EX SOLR
CUTANEOUS | Status: AC
  Filled 2016-07-21: qty 20000

## 2016-07-21 MED ORDER — HYDRALAZINE HCL 20 MG/ML IJ SOLN
INTRAMUSCULAR | Status: AC
  Filled 2016-07-21: qty 1

## 2016-07-21 MED ORDER — BUPIVACAINE-EPINEPHRINE 0.25% -1:200000 IJ SOLN
INTRAMUSCULAR | Status: DC | PRN
  Administered 2016-07-21: 10 mL

## 2016-07-21 MED ORDER — LACTATED RINGERS IV SOLN
INTRAVENOUS | Status: DC | PRN
  Administered 2016-07-21 (×2): via INTRAVENOUS

## 2016-07-21 MED ORDER — DEXMEDETOMIDINE HCL IN NACL 200 MCG/50ML IV SOLN
INTRAVENOUS | Status: AC
  Filled 2016-07-21: qty 50

## 2016-07-21 MED ORDER — DEXAMETHASONE SODIUM PHOSPHATE 10 MG/ML IJ SOLN
INTRAMUSCULAR | Status: DC | PRN
  Administered 2016-07-21: 10 mg via INTRAVENOUS

## 2016-07-21 MED ORDER — PHENYLEPHRINE 40 MCG/ML (10ML) SYRINGE FOR IV PUSH (FOR BLOOD PRESSURE SUPPORT)
PREFILLED_SYRINGE | INTRAVENOUS | Status: AC
  Filled 2016-07-21: qty 10

## 2016-07-21 MED ORDER — BSS IO SOLN
15.0000 mL | Freq: Once | INTRAOCULAR | Status: AC
  Administered 2016-07-21: 15 mL via INTRAOCULAR
  Filled 2016-07-21: qty 15

## 2016-07-21 MED ORDER — SODIUM CHLORIDE 0.9 % IJ SOLN
INTRAMUSCULAR | Status: DC | PRN
  Administered 2016-07-21: 20 mL

## 2016-07-21 MED ORDER — PROPOFOL 10 MG/ML IV BOLUS
INTRAVENOUS | Status: DC | PRN
Start: 2016-07-21 — End: 2016-07-21
  Administered 2016-07-21: 200 mg via INTRAVENOUS

## 2016-07-21 MED ORDER — DEXAMETHASONE SODIUM PHOSPHATE 10 MG/ML IJ SOLN
INTRAMUSCULAR | Status: AC
  Filled 2016-07-21: qty 1

## 2016-07-21 MED ORDER — CEFAZOLIN SODIUM-DEXTROSE 2-4 GM/100ML-% IV SOLN
2.0000 g | Freq: Three times a day (TID) | INTRAVENOUS | Status: AC
  Administered 2016-07-21 – 2016-07-22 (×2): 2 g via INTRAVENOUS
  Filled 2016-07-21 (×2): qty 100

## 2016-07-21 MED ORDER — SUCCINYLCHOLINE CHLORIDE 200 MG/10ML IV SOSY
PREFILLED_SYRINGE | INTRAVENOUS | Status: AC
  Filled 2016-07-21: qty 10

## 2016-07-21 MED ORDER — SUCCINYLCHOLINE CHLORIDE 200 MG/10ML IV SOSY
PREFILLED_SYRINGE | INTRAVENOUS | Status: DC | PRN
  Administered 2016-07-21: 140 mg via INTRAVENOUS

## 2016-07-21 MED ORDER — PROMETHAZINE HCL 25 MG/ML IJ SOLN: 6.2500 mg | INTRAMUSCULAR | Status: DC | PRN

## 2016-07-21 MED ORDER — KETAMINE HCL-SODIUM CHLORIDE 100-0.9 MG/10ML-% IV SOSY
PREFILLED_SYRINGE | INTRAVENOUS | Status: AC
  Filled 2016-07-21: qty 10

## 2016-07-21 MED ORDER — MIDAZOLAM HCL 2 MG/2ML IJ SOLN
INTRAMUSCULAR | Status: DC | PRN
  Administered 2016-07-21 (×2): 2 mg via INTRAVENOUS

## 2016-07-21 MED ORDER — PANTOPRAZOLE SODIUM 40 MG PO TBEC
40.0000 mg | DELAYED_RELEASE_TABLET | Freq: Every day | ORAL | Status: DC
  Administered 2016-07-21 – 2016-07-22 (×2): 40 mg via ORAL
  Filled 2016-07-21 (×2): qty 1

## 2016-07-21 MED ORDER — LIDOCAINE 2% (20 MG/ML) 5 ML SYRINGE
INTRAMUSCULAR | Status: AC
  Filled 2016-07-21: qty 5

## 2016-07-21 MED ORDER — KETOROLAC TROMETHAMINE 0.5 % OP SOLN
1.0000 [drp] | OPHTHALMIC | Status: AC
  Administered 2016-07-21: 1 [drp] via OPHTHALMIC
  Filled 2016-07-21: qty 3

## 2016-07-21 MED ORDER — SCOPOLAMINE 1 MG/3DAYS TD PT72
1.0000 | MEDICATED_PATCH | TRANSDERMAL | Status: DC
  Administered 2016-07-21: 1.5 mg via TRANSDERMAL
  Filled 2016-07-21: qty 1

## 2016-07-21 MED ORDER — METHYLENE BLUE 0.5 % INJ SOLN
INTRAVENOUS | Status: AC
  Filled 2016-07-21: qty 10

## 2016-07-21 MED FILL — Heparin Sodium (Porcine) Inj 1000 Unit/ML: INTRAMUSCULAR | Qty: 30 | Status: AC

## 2016-07-21 MED FILL — Sodium Chloride IV Soln 0.9%: INTRAVENOUS | Qty: 1000 | Status: AC

## 2016-07-21 SURGICAL SUPPLY — 86 items
BENZOIN TINCTURE PRP APPL 2/3 (GAUZE/BANDAGES/DRESSINGS) ×2 IMPLANT
BLADE CLIPPER SURG (BLADE) IMPLANT
BONE VIVIGEN FORMABLE 1.3CC (Bone Implant) ×2 IMPLANT
BUR PRESCISION 1.7 ELITE (BURR) IMPLANT
BUR ROUND PRECISION 4.0 (BURR) ×2 IMPLANT
BUR SABER RD CUTTING 3.0 (BURR) IMPLANT
CARTRIDGE OIL MAESTRO DRILL (MISCELLANEOUS) ×2 IMPLANT
CONT SPEC STER OR (MISCELLANEOUS) ×2 IMPLANT
COVER SURGICAL LIGHT HANDLE (MISCELLANEOUS) ×2 IMPLANT
DIFFUSER DRILL AIR PNEUMATIC (MISCELLANEOUS) ×4 IMPLANT
DRAIN CHANNEL 15F RND FF W/TCR (WOUND CARE) ×2 IMPLANT
DRAPE C-ARM 42X72 X-RAY (DRAPES) ×2 IMPLANT
DRAPE POUCH INSTRU U-SHP 10X18 (DRAPES) ×2 IMPLANT
DRAPE SURG 17X23 STRL (DRAPES) ×8 IMPLANT
DRSG MEPILEX BORDER 4X12 (GAUZE/BANDAGES/DRESSINGS) IMPLANT
DRSG MEPILEX BORDER 4X8 (GAUZE/BANDAGES/DRESSINGS) IMPLANT
DURAPREP 26ML APPLICATOR (WOUND CARE) ×2 IMPLANT
ELECT BLADE 4.0 EZ CLEAN MEGAD (MISCELLANEOUS) ×4
ELECT CAUTERY BLADE 6.4 (BLADE) ×4 IMPLANT
ELECT REM PT RETURN 9FT ADLT (ELECTROSURGICAL) ×2
ELECTRODE BLDE 4.0 EZ CLN MEGD (MISCELLANEOUS) ×2 IMPLANT
ELECTRODE REM PT RTRN 9FT ADLT (ELECTROSURGICAL) ×1 IMPLANT
EVACUATOR SILICONE 100CC (DRAIN) ×2 IMPLANT
FEE INTRAOP MONITOR IMPULS NCS (MISCELLANEOUS) ×1 IMPLANT
GAUZE SPONGE 4X4 12PLY STRL (GAUZE/BANDAGES/DRESSINGS) ×2 IMPLANT
GAUZE SPONGE 4X4 16PLY XRAY LF (GAUZE/BANDAGES/DRESSINGS) ×6 IMPLANT
GLOVE BIO SURGEON STRL SZ7 (GLOVE) ×2 IMPLANT
GLOVE BIO SURGEON STRL SZ8 (GLOVE) ×2 IMPLANT
GLOVE BIOGEL PI IND STRL 7.0 (GLOVE) ×2 IMPLANT
GLOVE BIOGEL PI IND STRL 8 (GLOVE) ×1 IMPLANT
GLOVE BIOGEL PI INDICATOR 7.0 (GLOVE) ×2
GLOVE BIOGEL PI INDICATOR 8 (GLOVE) ×1
GLOVE SKINSENSE NS SZ6.5 (GLOVE) ×1
GLOVE SKINSENSE STRL SZ6.5 (GLOVE) ×1 IMPLANT
GOWN STRL REUS W/ TWL LRG LVL3 (GOWN DISPOSABLE) ×3 IMPLANT
GOWN STRL REUS W/ TWL XL LVL3 (GOWN DISPOSABLE) ×1 IMPLANT
GOWN STRL REUS W/TWL LRG LVL3 (GOWN DISPOSABLE) ×3
GOWN STRL REUS W/TWL XL LVL3 (GOWN DISPOSABLE) ×1
GUIDEWIRE BLUNT VIPER II 1.45 (WIRE) ×2 IMPLANT
GUIDEWIRE SHARP VIPER II (WIRE) ×16 IMPLANT
INTRAOP MONITOR FEE IMPULS NCS (MISCELLANEOUS) ×1
INTRAOP MONITOR FEE IMPULSE (MISCELLANEOUS) ×1
IV CATH 14GX2 1/4 (CATHETERS) ×2 IMPLANT
KIT BASIN OR (CUSTOM PROCEDURE TRAY) ×2 IMPLANT
KIT POSITION SURG JACKSON T1 (MISCELLANEOUS) ×2 IMPLANT
KIT ROOM TURNOVER OR (KITS) ×2 IMPLANT
MARKER SKIN DUAL TIP RULER LAB (MISCELLANEOUS) ×2 IMPLANT
NEEDLE 22X1 1/2 (OR ONLY) (NEEDLE) ×2 IMPLANT
NEEDLE HYPO 25GX1X1/2 BEV (NEEDLE) ×2 IMPLANT
NEEDLE JAMSHIDI VIPER (NEEDLE) ×8 IMPLANT
NEEDLE SPNL 18GX3.5 QUINCKE PK (NEEDLE) ×4 IMPLANT
NS IRRIG 1000ML POUR BTL (IV SOLUTION) ×2 IMPLANT
OIL CARTRIDGE MAESTRO DRILL (MISCELLANEOUS) ×4
PACK LAMINECTOMY ORTHO (CUSTOM PROCEDURE TRAY) ×2 IMPLANT
PACK UNIVERSAL I (CUSTOM PROCEDURE TRAY) ×2 IMPLANT
PAD ARMBOARD 7.5X6 YLW CONV (MISCELLANEOUS) ×4 IMPLANT
PATTIES SURGICAL .5 X1 (DISPOSABLE) ×2 IMPLANT
PATTIES SURGICAL .5 X3 (DISPOSABLE) IMPLANT
PATTIES SURGICAL .5X1.5 (GAUZE/BANDAGES/DRESSINGS) ×2 IMPLANT
PATTIES SURGICAL .75X.75 (GAUZE/BANDAGES/DRESSINGS) ×2 IMPLANT
PROBE PEDCLE PROBE MAGSTM DISP (MISCELLANEOUS) ×2 IMPLANT
ROD LORDOSED 75MM VIPER 2 (Rod) ×2 IMPLANT
ROD VIPOR2 70MM PRE LARDOSED (Rod) ×4 IMPLANT
SCREW SET SINGLE INNER MIS (Screw) ×14 IMPLANT
SCREW XTAB POLY VIPER  7X45 (Screw) ×6 IMPLANT
SCREW XTAB POLY VIPER 7X45 (Screw) ×6 IMPLANT
SPONGE INTESTINAL PEANUT (DISPOSABLE) IMPLANT
SPONGE SURGIFOAM ABS GEL 100 (HEMOSTASIS) IMPLANT
STRIP CLOSURE SKIN 1/2X4 (GAUZE/BANDAGES/DRESSINGS) ×4 IMPLANT
SURGIFLO W/THROMBIN 8M KIT (HEMOSTASIS) IMPLANT
SUT MNCRL AB 4-0 PS2 18 (SUTURE) ×2 IMPLANT
SUT VIC AB 0 CT1 18XCR BRD 8 (SUTURE) ×2 IMPLANT
SUT VIC AB 0 CT1 8-18 (SUTURE) ×2
SUT VIC AB 1 CT1 18XCR BRD 8 (SUTURE) ×2 IMPLANT
SUT VIC AB 1 CT1 8-18 (SUTURE) ×2
SUT VIC AB 2-0 CT2 18 VCP726D (SUTURE) ×6 IMPLANT
SYR 20CC LL (SYRINGE) ×2 IMPLANT
SYR BULB IRRIGATION 50ML (SYRINGE) ×2 IMPLANT
SYR CONTROL 10ML LL (SYRINGE) ×4 IMPLANT
SYR TB 1ML LUER SLIP (SYRINGE) ×2 IMPLANT
TAP CANN VIPER2 DL 6.0 (TAP) ×4 IMPLANT
TOWEL OR 17X24 6PK STRL BLUE (TOWEL DISPOSABLE) ×2 IMPLANT
TOWEL OR 17X26 10 PK STRL BLUE (TOWEL DISPOSABLE) ×2 IMPLANT
TRAY FOLEY W/METER SILVER 16FR (SET/KITS/TRAYS/PACK) ×2 IMPLANT
WATER STERILE IRR 1000ML POUR (IV SOLUTION) ×2 IMPLANT
YANKAUER SUCT BULB TIP NO VENT (SUCTIONS) ×2 IMPLANT

## 2016-07-21 NOTE — Op Note (Signed)
NAMGeorgena [name]:  [name], [name]             ACCOUNT NO.:  [id number]  MEDICAL RECORD NO.:  [id number]  LOCATION:  [facility]                         FACILITY:  [facility]  PHYSICIAN:  Estill BambergMark Atwell Mcdanel, MD      DATE OF BIRTH:  [birth year]  DATE OF PROCEDURE:  07/21/2016                              OPERATIVE REPORT   PREOPERATIVE DIAGNOSIS:  Status post anterior lumbar interbody fusion, L4-5, L5-S1, requiring posterior instrumentation and fusion.  POSTOPERATIVE DIAGNOSIS:  Status post anterior lumbar interbody fusion, L4-5, L5-S1, requiring posterior instrumentation and fusion.  PROCEDURES: 1. Placement of posterior instrumentation, L4, L5, S1, bilaterally (7     x 45 mm pedicle screws). 2. Posterior spinal fusion, L4-5, L5-S1. 3. Use of morselized allograft - ViviGen. 4. Intraoperative use of fluoroscopy.  SURGEON:  Estill BambergMark Gordy Goar, M.D.  ASSISTANDorathy Daft:  Kayla McKenzie PA-C.  ANESTHESIA:  General endotracheal anesthesia.  COMPLICATIONS:  None.  DISPOSITION:  Stable.  ESTIMATED BLOOD LOSS:  Minimal.  INDICATIONS FOR SURGERY:  Briefly, [name] is a very pleasant 41- year-old male who did present to me with pain in his bilateral legs. Please refer to the operative report dated July 20, 2016, for a full account of the patient's preoperative symptoms and his indications for surgery.  The patient did present today for stage II of what was to be a 2-stage procedure, specifically, a posterior spinal fusion with instrumentation and allograft, spanning L4-S1.  DESCRIPTION OF PROCEDURE:  On July 21, 2016, the patient was brought to surgery and general endotracheal anesthesia was administered.  The patient was placed prone on a well-padded flat Jackson bed with a spinal frame.  Antibiotics were given.  The back was prepped and draped in the usual sterile fashion and a time-out procedure was performed.  I then made 2 paramedian incisions lateral to the pedicles, spanning L4-S1.  On the left side, the  posterior elements and posterolateral gutter of L4-5 and L5-S1 were identified and exposed and a 4 mm high-speed burr was used to decorticate the facet joints and posterior elements.  ViviGen was then packed into the posterolateral gutter on the left side at L4-5 and at L5-S1.  I then proceeded with the instrumentation portion of the procedure.  Liberally using AP and lateral fluoroscopy, I did cannulate the L4, L5, and S1 pedicles in the usual manner.  I did use triggered EMG to test each of the taps, and there was no tap that tested below 20 milliamps.  I was very pleased with the purchase of the taps.  I then placed 7 x 45 mm screws into the L4, L5, and S1 pedicles bilaterally.  A 7 mm rod was then chosen and secured into the tulip heads of the screws bilaterally from L4-S1.  I was very pleased with the purchase of the screws and the appearance of the AP and lateral fluoroscopic images after placing the rod.  I then placed caps over the tulip heads at L4, L5, and S1 bilaterally.  I was very pleased with the final AP and lateral fluoroscopic images.  The wounds were then copiously irrigated. The fascia was closed using #1 Vicryl followed by 2-0 Vicryl followed by 4-0 Monocryl.  Benzoin and Steri-Strips  were applied followed by sterile dressing.  All instrument counts were correct at the termination of the procedure.  Of note, I did use neurologic monitoring, there was no abnormal EMG activity noted throughout the surgery.  Also of note, per the neurologic monitoring technician, the patient's somatosensory evoked potentials were significantly decreased during the first approximately 20 minutes of surgery.  The left arm was repositioned multiple times, and padded very significantly multiple times, however, the somatosensory evoked potentials never returned back to baseline, despite multiple efforts between myself, the CRNA, the anesthesiologist, and the circulating nurse.  All efforts  were made to protect the ulnar nerve at the first moment of there being any changes.  The function of his ulnar nerve will certainly be re-examined postoperatively.  Of note, Jason Coop was my assistant throughout the surgery, and did aid in retraction, suctioning, and closure from start to finish.     Estill Bamberg, MD     MD/MEDQ  D:  07/21/2016  T:  07/21/2016  Job:  [id number]

## 2016-07-21 NOTE — Transfer of Care (Signed)
Immediate Anesthesia Transfer of Care Note  Patient: [name]  Procedure(s) Performed: Procedure(s) with comments: LUMBAR 4-5, LUMBAR 5-SACRUM 1 POSTERIOR LUMBLAR FUSION WITH INSTRUMENTATION AND ALLOGRAFT (N/A) - LUMBAR 4-5, LUMBAR 5-SACRUM 1 POSTERIOR LUMBLAR FUSION WITH INSTRUMENTATION AND ALLOGRAFT  Patient Location: PACU  Anesthesia Type:General  Level of Consciousness: awake, alert , sedated and patient cooperative  Airway & Oxygen Therapy: Patient Spontanous Breathing and Patient connected to face mask oxygen  Post-op Assessment: Report given to RN, Post -op Vital signs reviewed and stable and Patient moving all extremities X 4  Post vital signs: Reviewed and stable  Last Vitals:  Vitals:   07/21/16 0459 07/21/16 1121  BP: (!) 151/80   Pulse: 98   Resp: (!) 22   Temp: 37.2 C (P) 36.3 C    Last Pain:  Vitals:   07/21/16 0459  TempSrc: Oral  PainSc:       Patients Stated Pain Goal: 4 (07/20/16 2330)  Complications: No apparent anesthesia complications

## 2016-07-21 NOTE — Progress Notes (Signed)
Patient ID: [name], male   DOB: 16-Oct-1975, 41 y.o.   MRN: [id number] Comforable. D Ready for posterior surgery. Will not follow actively.  Call if we can assist

## 2016-07-21 NOTE — Progress Notes (Signed)
Patient arrived from PACU. Request pain medicine. A&O X4.  Patient is due to void about 5.30pm

## 2016-07-21 NOTE — Progress Notes (Signed)
Patient ambulated about [id number] with staff without incident. PRN meds administered to help patient move his bowel. Patient denies any request/concerns at this time. Will continue to monitor.

## 2016-07-21 NOTE — Anesthesia Procedure Notes (Signed)
Procedure Name: Intubation Date/Time: 07/21/2016 7:38 AM Performed by: Geraldo DockerSOLHEIM, Kennedie Pardoe SALOMON Pre-anesthesia Checklist: Patient identified, Patient being monitored, Timeout performed, Emergency Drugs available and Suction available Patient Re-evaluated:Patient Re-evaluated prior to inductionOxygen Delivery Method: Circle System Utilized Preoxygenation: Pre-oxygenation with 100% oxygen Intubation Type: IV induction Ventilation: Mask ventilation without difficulty and Oral airway inserted - appropriate to patient size Laryngoscope Size: Miller and 3 Grade View: Grade II Tube type: Oral Tube size: 8.0 mm Number of attempts: 1 Airway Equipment and Method: Stylet Placement Confirmation: ETT inserted through vocal cords under direct vision,  positive ETCO2 and breath sounds checked- equal and bilateral Secured at: 23 cm Tube secured with: Tape Dental Injury: Teeth and Oropharynx as per pre-operative assessment

## 2016-07-21 NOTE — Anesthesia Postprocedure Evaluation (Addendum)
Anesthesia Post Note  Patient: [name]  Procedure(s) Performed: Procedure(s) (LRB): LUMBAR 4-5, LUMBAR 5-SACRUM 1 POSTERIOR LUMBLAR FUSION WITH INSTRUMENTATION AND ALLOGRAFT (N/A)  Patient location during evaluation: PACU Anesthesia Type: General Level of consciousness: sedated Pain management: pain level controlled Vital Signs Assessment: post-procedure vital signs reviewed and stable Respiratory status: spontaneous breathing and respiratory function stable Cardiovascular status: stable Anesthetic complications: yes Anesthetic complication details: injury of corneaComments: Possible corneal abrasion.  Vision normal, no foreign body noted.  Corneal abrasion order-set initiated.       Last Vitals:  Vitals:   07/21/16 1251 07/21/16 1306  BP: (!) 158/100 131/89  Pulse: (!) 101 (!) 102  Resp: 13   Temp:      Last Pain:  Vitals:   07/21/16 1250  TempSrc:   PainSc: 3                  Antino Mayabb DANIEL

## 2016-07-22 NOTE — Evaluation (Signed)
Physical Therapy Evaluation Patient Details Name: [name] MRN: [id number] DOB: Sep 07, 1975 Today's Date: 07/22/2016   History of Present Illness  Patient is a 41 y/o male admitted due to chronic back pain with previous history of decompression x 4.  Now s/p two stage fusion with ALIF and posterior fixation for L4-5, L5-S1.  PMH positive for HTN , depression, arthritis, insomnia and anxiety.  Clinical Impression  Patient presents with decreased mobility due to pain, back precautions, brace and general weakness.  He will benefit from skilled PT during acute stay to ensure safe mobility maintaining back precautions.  Currently S level and plans possible d/c to mother's home.  If goes to his home will need instructions on stairs as has flight for entry to second floor apartment.    Follow Up Recommendations No PT follow up    Equipment Recommendations  None recommended by PT    Recommendations for Other Services       Precautions / Restrictions Precautions Precautions: Back Precaution Booklet Issued: Yes (comment) Required Braces or Orthoses: Spinal Brace Spinal Brace: Thoracolumbosacral orthotic;Applied in sitting position Restrictions Weight Bearing Restrictions: No      Mobility  Bed Mobility Overal bed mobility: Needs Assistance Bed Mobility: Rolling;Sidelying to Sit Rolling: Supervision Sidelying to sit: Supervision       General bed mobility comments: reminders initially for technique, performed without physical assist  Transfers Overall transfer level: Needs assistance Equipment used: Rolling walker (2 wheeled) Transfers: Sit to/from Stand Sit to Stand: Supervision         General transfer comment: increased time from elevated height bed  Ambulation/Gait Ambulation/Gait assistance: Supervision Ambulation Distance (Feet): 200 Feet Assistive device: Rolling walker (2 wheeled) Gait Pattern/deviations: Step-through pattern;Decreased stride length;Wide base  of support     General Gait Details: S for safety, but maintaining precautions with RW in hallway, in room ambulated no device without evidence for imbalance except wider BOS, some mild R lateral hip instability noted  Stairs            Wheelchair Mobility    Modified Rankin (Stroke Patients Only)       Balance Overall balance assessment: Needs assistance   Sitting balance-Leahy Scale: Good       Standing balance-Leahy Scale: Good                               Pertinent Vitals/Pain Pain Assessment: 0-10 Pain Score: 8  Pain Location: back, stomach and hips Pain Descriptors / Indicators: Aching;Operative site guarding Pain Intervention(s): Monitored during session;Repositioned    Home Living Family/patient expects to be discharged to:: Private residence Living Arrangements: Parent Available Help at Discharge: Family Type of Home: House (parent's home (pt lives in apt on second level)) Home Access: Level entry     Home Layout: One level Home Equipment: Environmental consultant - 2 wheels;Cane - single point      Prior Function Level of Independence: Independent         Comments: disabled since 2013     Hand Dominance   Dominant Hand: Right    Extremity/Trunk Assessment   Upper Extremity Assessment Upper Extremity Assessment: Overall WFL for tasks assessed    Lower Extremity Assessment Lower Extremity Assessment: Generalized weakness       Communication   Communication: No difficulties  Cognition Arousal/Alertness: Awake/alert Behavior During Therapy: WFL for tasks assessed/performed Overall Cognitive Status: Within Functional Limits for tasks assessed  General Comments General comments (skin integrity, edema, etc.): discussed method for car transfer into truck with running board to maintain precautions    Exercises     Assessment/Plan    PT Assessment Patient needs continued PT  services  PT Problem List Decreased strength;Decreased balance;Decreased knowledge of use of DME;Decreased mobility;Decreased knowledge of precautions;Pain       PT Treatment Interventions DME instruction;Therapeutic activities;Therapeutic exercise;Gait training;Patient/family education;Balance training;Functional mobility training    PT Goals (Current goals can be found in the Care Plan section)  Acute Rehab PT Goals Patient Stated Goal: To return to active lifestyle coaching and working out PT Goal Formulation: With patient Time For Goal Achievement: 07/29/16 Potential to Achieve Goals: Good    Frequency Min 5X/week   Barriers to discharge        Co-evaluation               End of Session Equipment Utilized During Treatment: Back brace Activity Tolerance: Patient tolerated treatment well Patient left: in chair;with call bell/phone within reach;with chair alarm set   PT Visit Diagnosis: Difficulty in walking, not elsewhere classified (R26.2);Pain Pain - Right/Left: Right Pain - part of body: Hip    Time: 1610-9604 PT Time Calculation (min) (ACUTE ONLY): 23 min   Charges:   PT Evaluation $PT Eval Moderate Complexity: 1 Procedure PT Treatments $Gait Training: 8-22 mins   PT G CodesSheran [name], [name] [phone] 07/22/2016   [name] 07/22/2016, 11:06 AM

## 2016-07-22 NOTE — Progress Notes (Signed)
    Patient doing well Minimal back pain Denies leg pain Has been ambulating   Physical Exam: Vitals:   07/22/16 0208 07/22/16 0610  BP: 118/68 139/76  Pulse: (!) 126 (!) 112  Resp: 20 20  Temp: 99.2 F (37.3 C) 98.9 F (37.2 C)    Dressing in place NVI  POD #1 and 2 s/p L4-S1 A/P fusion, doing very well  - up with PT/OT, encourage ambulation - Percocet for pain, Valium for muscle spasms - patient requesting to stay until tomorrow, which I feel is reasonable, given his surgery, so will plan for d/c tomorrow

## 2016-07-22 NOTE — Care Management Note (Signed)
Case Management Note  Patient Details  Name: [name] MRN: [id number] Date of Birth: May 30, 1975  Subjective/Objective:     Pt s/p lumbar fusion. He is from home alone.                Action/Plan: Patient d/cing home with self care. No f/u or DME needs per PT. Pt is going to stay with his parents for several weeks prior to returning home. Parents are to provide transportation home.   Expected Discharge Date:  07/23/16               Expected Discharge Plan:  Home/Self Care  In-House Referral:     Discharge planning Services  CM Consult  Post Acute Care Choice:    Choice offered to:     DME Arranged:    DME Agency:     HH Arranged:    HH Agency:     Status of Service:  Completed, signed off  If discussed at [employer] of Stay Meetings, dates discussed:    Additional Comments:  Kermit Balo, RN 07/22/2016, 12:54 PM

## 2016-07-22 NOTE — Evaluation (Signed)
Occupational Therapy Evaluation and Discharge Patient Details Name: [name] MRN: [id number] DOB: [birth year] Today's Date: 07/22/2016    History of Present Illness Patient is a 41 y/o male admitted due to chronic back pain with previous history of decompression x 4.  Now s/p two stage fusion with ALIF and posterior fixation for L4-5, L5-S1.  PMH positive for HTN , depression, arthritis, insomnia and anxiety.   Clinical Impression   PTA Pt independent in ADL and mobility but lifestyle limited by pain. Pt currently at supervision level for ADL and mobility with RW. Pt able to recall precautions and educated on compensatory strategies during ADL for maintaining precautions (handout reviewed again). Pt also educated, demo, and practiced with AE kit. Education complete. Pt with no questions or concerns at the end of session. OT to sign off thank you for the referral.    Follow Up Recommendations  No OT follow up;Supervision - Intermittent    Equipment Recommendations  None recommended by OT (Pt has appropriate DME)    Recommendations for Other Services       Precautions / Restrictions Precautions Precautions: Back Precaution Booklet Issued: Yes (comment) (during PT eval earlier) Precaution Comments: recalled 3/3 precautions Required Braces or Orthoses: Spinal Brace Spinal Brace: Thoracolumbosacral orthotic;Applied in sitting position Restrictions Weight Bearing Restrictions: No      Mobility Bed Mobility Overal bed mobility: Needs Assistance Bed Mobility: Rolling;Sidelying to Sit Rolling: Supervision Sidelying to sit: Supervision       General bed mobility comments: reminders initially for technique, performed without physical assist  Transfers Overall transfer level: Needs assistance Equipment used: Rolling walker (2 wheeled) Transfers: Sit to/from Stand Sit to Stand: Supervision         General transfer comment: increased time from elevated height bed     Balance Overall balance assessment: Needs assistance Sitting-balance support: No upper extremity supported;Feet supported Sitting balance-Leahy Scale: Good     Standing balance support: Bilateral upper extremity supported;During functional activity;Single extremity supported Standing balance-Leahy Scale: Good                             ADL either performed or assessed with clinical judgement   ADL Overall ADL's : Needs assistance/impaired                                       General ADL Comments: overall at supervision level. Pt able to bring feet to knees (crossing) for socks but also provided education with AE kit (grabber/reacher, long handle shoe horn, long handle sponge, and sock donner). Also educated in compensatory strategies to maintain back precautions like cup method for oral care and setting things up on the right to pevent bending and twisting.  Pt also educated in toileting tongs and method for folding paper around. Pt also educated in brace and how to "re-tighten" when taking off prior to putting it back on     Vision Patient Visual Report: No change from baseline Vision Assessment?: No apparent visual deficits     Perception     Praxis      Pertinent Vitals/Pain Pain Assessment: 0-10 Pain Score: 8  Pain Location: back, stomach and hips Pain Descriptors / Indicators: Aching;Operative site guarding Pain Intervention(s): Limited activity within patient's tolerance;Repositioned;Utilized relaxation techniques (encouraged breathing)     Hand Dominance Right   Extremity/Trunk Assessment Upper Extremity Assessment Upper  Extremity Assessment: Overall WFL for tasks assessed   Lower Extremity Assessment Lower Extremity Assessment: Defer to PT evaluation   Cervical / Trunk Assessment Cervical / Trunk Assessment: Other exceptions Cervical / Trunk Exceptions: s/p surgery   Communication Communication Communication: No difficulties    Cognition Arousal/Alertness: Awake/alert Behavior During Therapy: WFL for tasks assessed/performed Overall Cognitive Status: Within Functional Limits for tasks assessed                                     General Comments  discussed method for car transfer into truck with running board to maintain precautions    Exercises     Shoulder Instructions      Home Living Family/patient expects to be discharged to:: Private residence Living Arrangements: Parent Available Help at Discharge: Family Type of Home: House (parent home) Home Access: Level entry     Home Layout: One level     Bathroom Shower/Tub: Teacher, early years/pre: Handicapped height Bathroom Accessibility: Yes How Accessible: Accessible via walker Home Equipment: Environmental consultant - 2 wheels;Cane - single point;Bedside commode;Shower seat;Grab bars - tub/shower;Hand held shower head;Adaptive equipment Adaptive Equipment: Reacher;Sock aid;Long-handled sponge        Prior Functioning/Environment Level of Independence: Independent        Comments: disabled since 2013; likes to volunteer at his daughters school        OT Problem List: Decreased activity tolerance;Impaired balance (sitting and/or standing);Pain;Obesity;Decreased knowledge of use of DME or AE;Decreased safety awareness;Decreased knowledge of precautions      OT Treatment/Interventions:      OT Goals(Current goals can be found in the care plan section) Acute Rehab OT Goals Patient Stated Goal: To return to active lifestyle coaching and working out OT Goal Formulation: With patient Time For Goal Achievement: 07/29/16 Potential to Achieve Goals: Good  OT Frequency:     Barriers to D/C:            Co-evaluation              End of Session Equipment Utilized During Treatment: Gait belt;Rolling walker;Back brace;Other (comment) (AE kit) Nurse Communication: Mobility status;Precautions  Activity Tolerance: Patient  tolerated treatment well Patient left: in bed;with call bell/phone within reach  OT Visit Diagnosis: Unsteadiness on feet (R26.81);Pain Pain - Right/Left: Right Pain - part of body: Leg (back)                Time: 2694-8546 OT Time Calculation (min): 20 min Charges:  OT General Charges $OT Visit: 1 Procedure OT Evaluation $OT Eval Moderate Complexity: 1 Procedure G-Codes:     Hulda Humphrey OTR/L [place] 07/22/2016, 1:33 PM

## 2016-07-22 NOTE — Addendum Note (Signed)
Addendum  created 07/22/16 1317 by Fransisca Kaufmann, CRNA   Anesthesia Intra Meds edited

## 2016-07-23 NOTE — Progress Notes (Signed)
qPhysical Therapy Treatment Patient Details Name: [name] MRN: [id number] DOB: 1975/08/02 Today's Date: 07/23/2016    History of Present Illness Patient is a 41 y/o male admitted due to chronic back pain with previous history of decompression x 4.  Now s/p two stage fusion with ALIF and posterior fixation for L4-5, L5-S1.  PMH positive for HTN , depression, arthritis, insomnia and anxiety.    PT Comments    Patient moving well. Able to negotiate flight of stairs with min guard assist.  Ready to d/c from PT perspective.   Follow Up Recommendations  No PT follow up     Equipment Recommendations  3in1 (PT)    Recommendations for Other Services       Precautions / Restrictions Precautions Precautions: Back Precaution Comments: recalled 3/3 precautions Required Braces or Orthoses: Spinal Brace Spinal Brace: Thoracolumbosacral orthotic;Applied in sitting position Restrictions Weight Bearing Restrictions: No    Mobility  Bed Mobility Overal bed mobility: Modified Independent             General bed mobility comments: Patient uses correct technique.  No assist needed.  Mod assist to don/doff back brace.  Patient was able to instruct PT on how to assist him with this.  Transfers Overall transfer level: Modified independent Equipment used: Rolling walker (2 wheeled)             General transfer comment: Increased time to stand from lower bed.  Good technique.  Ambulation/Gait Ambulation/Gait assistance: Modified independent (Device/Increase time) Ambulation Distance (Feet): 200 Feet Assistive device: Rolling walker (2 wheeled) Gait Pattern/deviations: Step-through pattern;Decreased stride length Gait velocity: decreased Gait velocity interpretation: Below normal speed for age/gender General Gait Details: Patient demonstrated safe use of RW and fairly upright posture.  Slow pace.   Stairs Stairs: Yes   Stair Management: One rail Right;Step to  pattern;Forwards Number of Stairs: 12 General stair comments: Patient able to negotiate flight of stairs with one rail and step-to pattern with min guard assist for safety.  Also instructed patient on using sideways approach with both hands on rail.  Wheelchair Mobility    Modified Rankin (Stroke Patients Only)       Balance                                            Cognition Arousal/Alertness: Awake/alert Behavior During Therapy: WFL for tasks assessed/performed Overall Cognitive Status: Within Functional Limits for tasks assessed                                        Exercises      General Comments        Pertinent Vitals/Pain Pain Assessment: 0-10 Pain Score: 6  Pain Location: Incision site abdomen, back Pain Descriptors / Indicators: Aching;Sore Pain Intervention(s): Monitored during session;Repositioned    Home Living                      Prior Function            PT Goals (current goals can now be found in the care plan section) Acute Rehab PT Goals Patient Stated Goal: To return to active lifestyle coaching and working out Progress towards PT goals: Progressing toward goals    Frequency    Min 5X/week  PT Plan Current plan remains appropriate;Equipment recommendations need to be updated    Co-evaluation             End of Session Equipment Utilized During Treatment: Back brace Activity Tolerance: Patient tolerated treatment well Patient left: in bed;with call bell/phone within reach (sitting EOB for lunch) Nurse Communication: Mobility status (Needs 3-N-1 BSC) PT Visit Diagnosis: Difficulty in walking, not elsewhere classified (R26.2);Pain Pain - part of body:  (back)     Time: 1610-9604 PT Time Calculation (min) (ACUTE ONLY): 20 min  Charges:  $Gait Training: 8-22 mins                    G Codes:       Durenda Hurt. Renaldo Fiddler, [facility] Acute Rehab Services Pager [phone]    Vena Austria 07/23/2016, 12:53 PM

## 2016-07-23 NOTE — Progress Notes (Signed)
Subjective: 2 Days Post-Op Procedure(s) (LRB): LUMBAR 4-5, LUMBAR 5-SACRUM 1 POSTERIOR LUMBLAR FUSION WITH INSTRUMENTATION AND ALLOGRAFT (N/A) Patient reports pain as moderate.  Taking by mouth and voiding okay. Had BM this morning. Positive flatus. Ambulating in hall with a walker.  Objective: Vital signs in last 24 hours: Temp:  [98.4 F (36.9 C)-99.8 F (37.7 C)] 98.9 F (37.2 C) (03/31 0639) Pulse Rate:  [106-126] 106 (03/31 0639) Resp:  [19-20] 20 (03/31 0639) BP: (109-127)/(67-83) 118/67 (03/31 0639) SpO2:  [95 %-99 %] 96 % (03/31 0639)  Intake/Output from previous day: No intake/output data recorded. Intake/Output this shift: No intake/output data recorded.  No results for input(s): HGB in the last 72 hours. No results for input(s): WBC, RBC, HCT, PLT in the last 72 hours. No results for input(s): NA, K, CL, CO2, BUN, CREATININE, GLUCOSE, CALCIUM in the last 72 hours. No results for input(s): LABPT, INR in the last 72 hours. Lumbar spine exam: Anterior and posterior dressings are clean and dry. His bilateral calves are soft. He has excellent motor strength in both lower extremities with normal sensation.   Assessment/Plan: 2 Days Post-Op Procedure(s) (LRB): LUMBAR 4-5, LUMBAR 5-SACRUM 1 POSTERIOR LUMBLAR FUSION WITH INSTRUMENTATION AND ALLOGRAFT (N/A) also, status post ALIF Plan: Discharge home today. Rx for Percocet and Valium. He has a follow-up appointment in approximately 10-14 days.   Nocole Zammit G 07/23/2016, 8:55 AM

## 2016-07-23 NOTE — Plan of Care (Signed)
Problem: Acute Rehab PT Goals(only PT should resolve) Goal: Pt Will Go Up/Down Stairs Outcome: Progressing Requires min guard for stairs.

## 2016-07-23 NOTE — Progress Notes (Signed)
Pt discharge education and instructions completed with pt and family at bedside. All voices understanding and denies any questions. Pt IV removed; abd incision dsg remains, clean, dry and intact. Back incision dsg changed; steri-strips remains intact and gauze dsg applied clean, dry and intact. Pt handed his prescriptions for percocet and valium. Pt home DME 3-1 delivered to pt at bedside. Pt transported off unit via wheelchair with belongings and family at side. Dionne Bucy RN

## 2016-07-25 ENCOUNTER — Encounter (HOSPITAL_COMMUNITY): Payer: Self-pay | Admitting: Orthopedic Surgery

## 2016-08-03 NOTE — Discharge Summary (Signed)
Patient ID: [name] MRN: [id number] DOB/AGE: 39-May-1977 39 y.o.  Admit date: 07/20/2016 Discharge date: 07/23/2016  Admission Diagnoses:  Active Problems:   Radiculopathy   Discharge Diagnoses:  Same  Past Medical History:  Diagnosis Date  . Anxiety   . Arthritis   . Bilateral leg pain   . Chronic back pain   . Hypertension   . Insomnia   . Insomnia   . Severe major depression, single episode (HCC)     Surgeries: Procedure(s): L4-S1 ANT Lumbar Fusion LUMBAR 4-5, LUMBAR 5-SACRUM 1 POSTERIOR LUMBLAR FUSION WITH INSTRUMENTATION AND ALLOGRAFT on 07/20/2016 - 07/21/2016   Consultants: Dr Early anterior exposure  Discharged Condition: Improved  Hospital Course: [name] is an 41 y.o. male who was admitted 07/20/2016 for operative treatment of radiculopathy and DDD. Patient has severe unremitting pain that affects sleep, daily activities, and work/hobbies. After pre-op clearance the patient was taken to the operating room on 07/20/2016 - 07/21/2016 and underwent  Procedure(s): LUMBAR 4-5, LUMBAR 5-SACRUM 1 POSTERIOR LUMBLAR FUSION WITH INSTRUMENTATION AND ALLOGRAFT.    Patient was given perioperative antibiotics:  Anti-infectives    Start     Dose/Rate Route Frequency Ordered Stop   07/21/16 2100  ceFAZolin (ANCEF) IVPB 2g/100 mL premix     2 g 200 mL/hr over 30 Minutes Intravenous Every 8 hours 07/21/16 1324 07/22/16 0520   07/21/16 0700  ceFAZolin (ANCEF) 3 g in dextrose 5 % 50 mL IVPB     3 g 130 mL/hr over 30 Minutes Intravenous To ShortStay Surgical 07/19/16 0913 07/20/16 1153   07/20/16 1800  ceFAZolin (ANCEF) IVPB 2g/100 mL premix     2 g 200 mL/hr over 30 Minutes Intravenous Every 8 hours 07/20/16 1641 07/21/16 0158   07/20/16 1115  ceFAZolin (ANCEF) 3 g in dextrose 5 % 50 mL IVPB  Status:  Discontinued     3 g 130 mL/hr over 30 Minutes Intravenous To Surgery 07/20/16 1109 07/20/16 1637   07/20/16 0700  ceFAZolin (ANCEF) 3 g in dextrose 5 % 50 mL IVPB   Status:  Discontinued     3 g 130 mL/hr over 30 Minutes Intravenous To ShortStay Surgical 07/19/16 0913 07/20/16 1637       Patient was given sequential compression devices, early ambulation to prevent DVT.  Patient benefited maximally from hospital stay and there were no complications.    Recent vital signs: BP 121/80 (BP Location: Left Arm)   Pulse (!) 115   Temp 99 F (37.2 C) (Oral)   Resp 18   Ht  (1.803 m)   Wt (!) 151 kg (333 lb)   SpO2 97%   BMI 46.44 kg/m    Discharge Medications:   Allergies as of 07/23/2016      Reactions   Trazodone And Nefazodone Other (See Comments)   PROLONGED ERECTION      Medication List    TAKE these medications   amLODipine 10 MG tablet Commonly known as:  NORVASC Take 10 mg by mouth daily.   cloNIDine 0.1 MG tablet Commonly known as:  CATAPRES Take 1 tablet by mouth daily.   diazepam 10 MG tablet Commonly known as:  VALIUM Take 10 mg by mouth at bedtime as needed for anxiety or sleep.   metoprolol succinate 50 MG 24 hr tablet Commonly known as:  TOPROL-XL Take 50 mg by mouth daily. Take with or immediately following a meal.       Diagnostic Studies: Dg Lumbar Spine 2-3 Views  Result Date: 07/21/2016 CLINICAL DATA:  L4-5, L5-S1 fusion EXAM: DG C-ARM 61-120 MIN; LUMBAR SPINE - 2-3 VIEW COMPARISON:  MRI 05/11/2016 FINDINGS: Changes of changes of anterior and posterior fusion at 2 levels in the lower lumbar spine, reportedly at L4-5 and L5-S1. Transitional anatomy at the lumbosacral junction. No visible hardware or bony complicating feature. IMPRESSION: Changes of 2 level fusion anteriorly and posteriorly. No visible complicating feature. Electronically Signed   By: Charlett Nose M.D.   On: 07/21/2016 11:36   Dg Lumbar Spine 1 View  Result Date: 07/20/2016 CLINICAL DATA:  41 year old male undergoing lumbar surgery. EXAM: LUMBAR SPINE - 1 VIEW; DG C-ARM GT 120 MIN COMPARISON:  Outside lumbar MRI 05/11/2016 available on  [facility] PACS FLUOROSCOPY TIME:  0 minutes 54 seconds FINDINGS: Single intraoperative lateral fluoroscopic spot film at the lumbosacral junction demonstrates anterior body and interbody fusion hardware placement at the lower 2 lumbarized levels. IMPRESSION: Two level lower lumbar interbody fusion underway. Electronically Signed   By: Odessa Fleming M.D.   On: 07/20/2016 14:29   Dg C-arm 61-120 Min  Result Date: 07/21/2016 CLINICAL DATA:  L4-5, L5-S1 fusion EXAM: DG C-ARM 61-120 MIN; LUMBAR SPINE - 2-3 VIEW COMPARISON:  MRI 05/11/2016 FINDINGS: Changes of changes of anterior and posterior fusion at 2 levels in the lower lumbar spine, reportedly at L4-5 and L5-S1. Transitional anatomy at the lumbosacral junction. No visible hardware or bony complicating feature. IMPRESSION: Changes of 2 level fusion anteriorly and posteriorly. No visible complicating feature. Electronically Signed   By: Charlett Nose M.D.   On: 07/21/2016 11:36   Dg C-arm Gt 120 Min  Result Date: 07/20/2016 CLINICAL DATA:  41 year old male undergoing lumbar surgery. EXAM: LUMBAR SPINE - 1 VIEW; DG C-ARM GT 120 MIN COMPARISON:  Outside lumbar MRI 05/11/2016 available on [facility] PACS FLUOROSCOPY TIME:  0 minutes 54 seconds FINDINGS: Single intraoperative lateral fluoroscopic spot film at the lumbosacral junction demonstrates anterior body and interbody fusion hardware placement at the lower 2 lumbarized levels. IMPRESSION: Two level lower lumbar interbody fusion underway. Electronically Signed   By: Odessa Fleming M.D.   On: 07/20/2016 14:29   Dg Or Local Abdomen  Result Date: 07/20/2016 CLINICAL DATA:  Missing tool count.  Lumbar surgery. EXAM: OR LOCAL ABDOMEN COMPARISON:  MRI 05/11/2016. FINDINGS: Lumbar vertebra numbered with what appears to be the lowest segmented vertebra as L5. Single AP view of the spine reveals a surgical screw at the level of L5 and S1 . Metallic 10 point densities noted over the L4-5 and L5-S1 disc spaces consistent with inter disc  fusion device is. Tiny density noted over the mid sacrum, most likely calcification . No definite radiopaque foreign body. IMPRESSION: Postsurgical changes.  No definite radiopaque foreign body . Electronically Signed   By: Maisie Fus  Register   On: 07/20/2016 14:13    Disposition: 01-Home or Self Care  Discharge Instructions    Diet - low sodium heart healthy    Complete by:  As directed    Increase activity slowly    Complete by:  As directed      POD #2 and 3 s/p L4-S1 A/P fusion, doing very well  - up with PT/OT, encourage ambulation - Percocet for pain, Valium for muscle spasms -Written scripts for pain signed and in chart -D/C instructions sheet printed and in chart -D/C today  -F/U in office 2 weeks   Signed: Georga Bora 08/03/2016, 1:14 PM

## 2016-08-08 ENCOUNTER — Encounter (HOSPITAL_COMMUNITY): Payer: Self-pay | Admitting: Orthopedic Surgery

## 2016-09-23 NOTE — Addendum Note (Signed)
Addendum  created 09/23/16 1303 by Aman Batley, MD   Sign clinical note    

## 2016-09-24 NOTE — Addendum Note (Signed)
Addendum  created 09/24/16 1054 by Asalee Barrette, MD   Sign clinical note    

## 2018-09-02 IMAGING — CR DG CHEST 2V
2 series · 2 of 2 positions shown · non-contrast
Comparison: None in PACs

CLINICAL DATA: Preoperative examination prior to lumbar surgery.
Nonsmoker. History of hypertension.

EXAM:
CHEST  2 VIEW

[w chest pa]
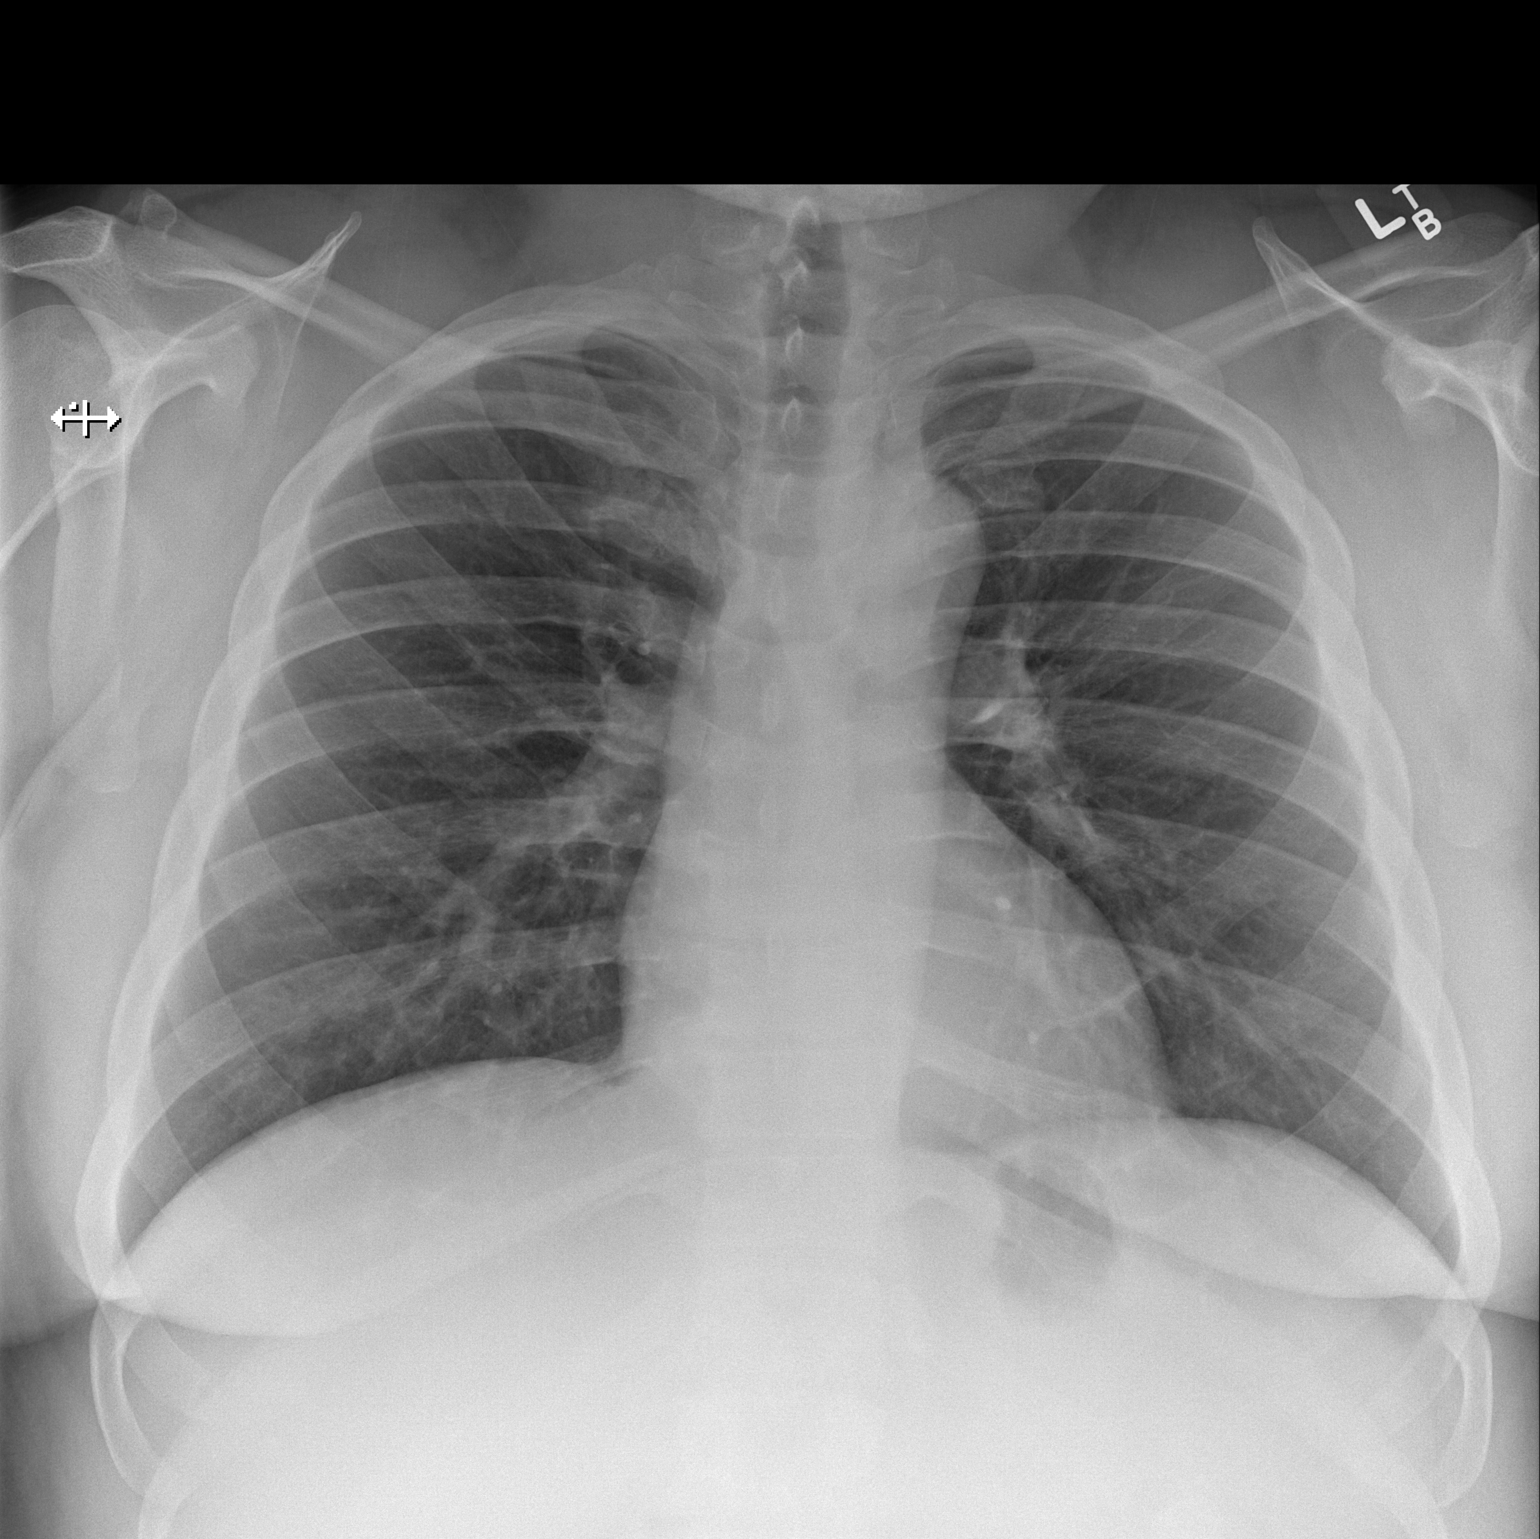

[w chest lat]
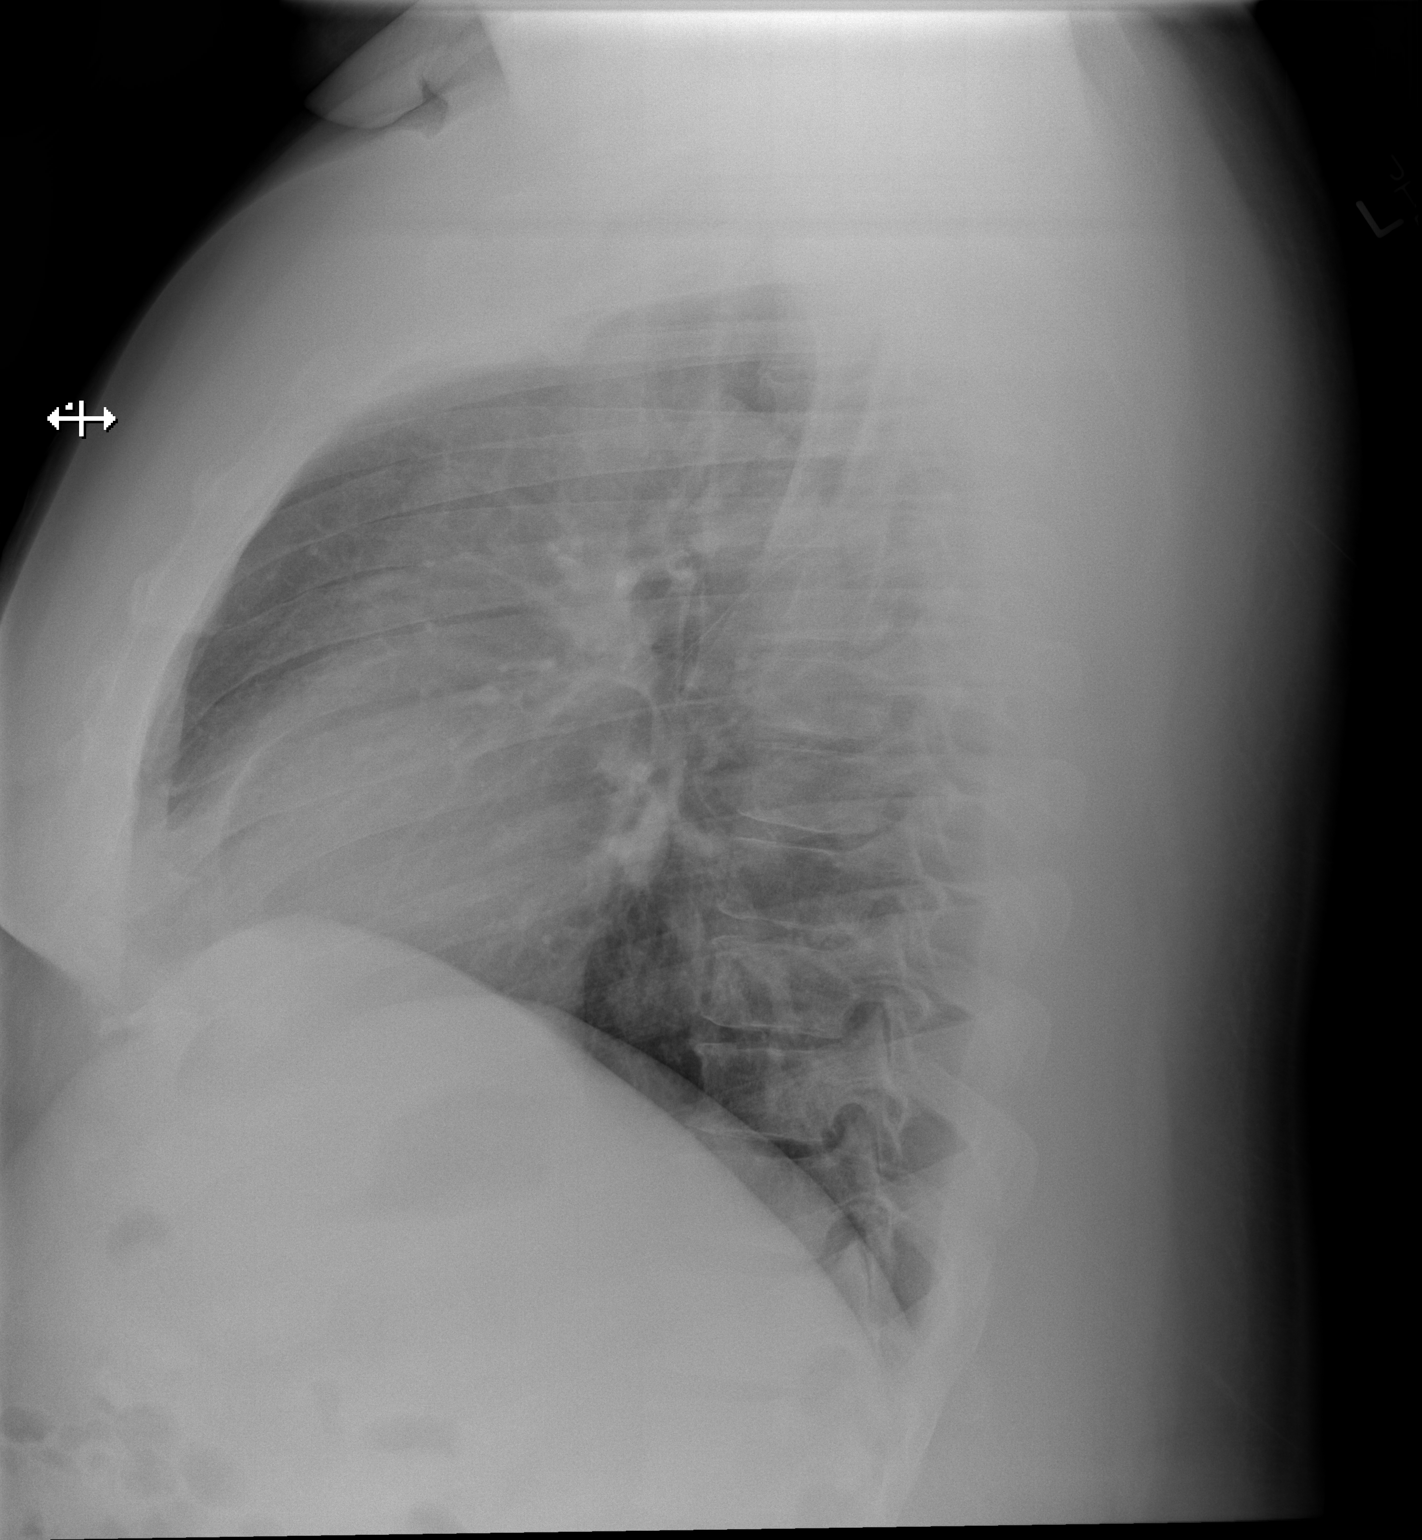

[2 of 2 positions shown; findings below may reference images not displayed]

FINDINGS: The lungs are adequately inflated and clear. The heart and pulmonary
vascularity are normal. The mediastinum is normal in width. There is
no pleural effusion. The bony thorax is unremarkable.
IMPRESSION: There is no active cardiopulmonary disease.

## 2018-10-02 IMAGING — RF DG C-ARM 61-120 MIN
1 series · 2 of 2 positions shown · non-contrast
Comparison: MRI 05/11/2016

CLINICAL DATA: L4-5, L5-S1 fusion

EXAM:
DG C-ARM 61-120 MIN; LUMBAR SPINE - 2-3 VIEW

[Series 1: run · 2 of 2 slices shown]
[im 1/2]
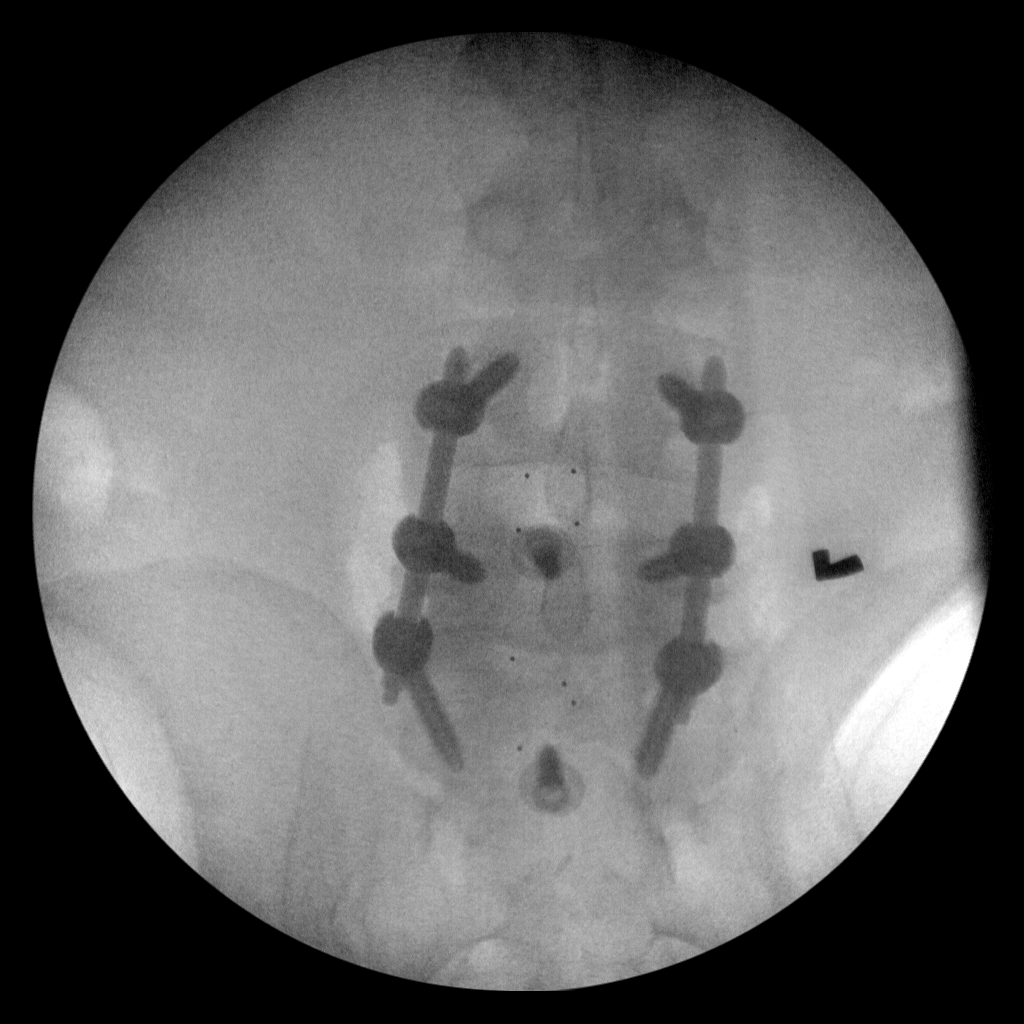
[im 2/2]
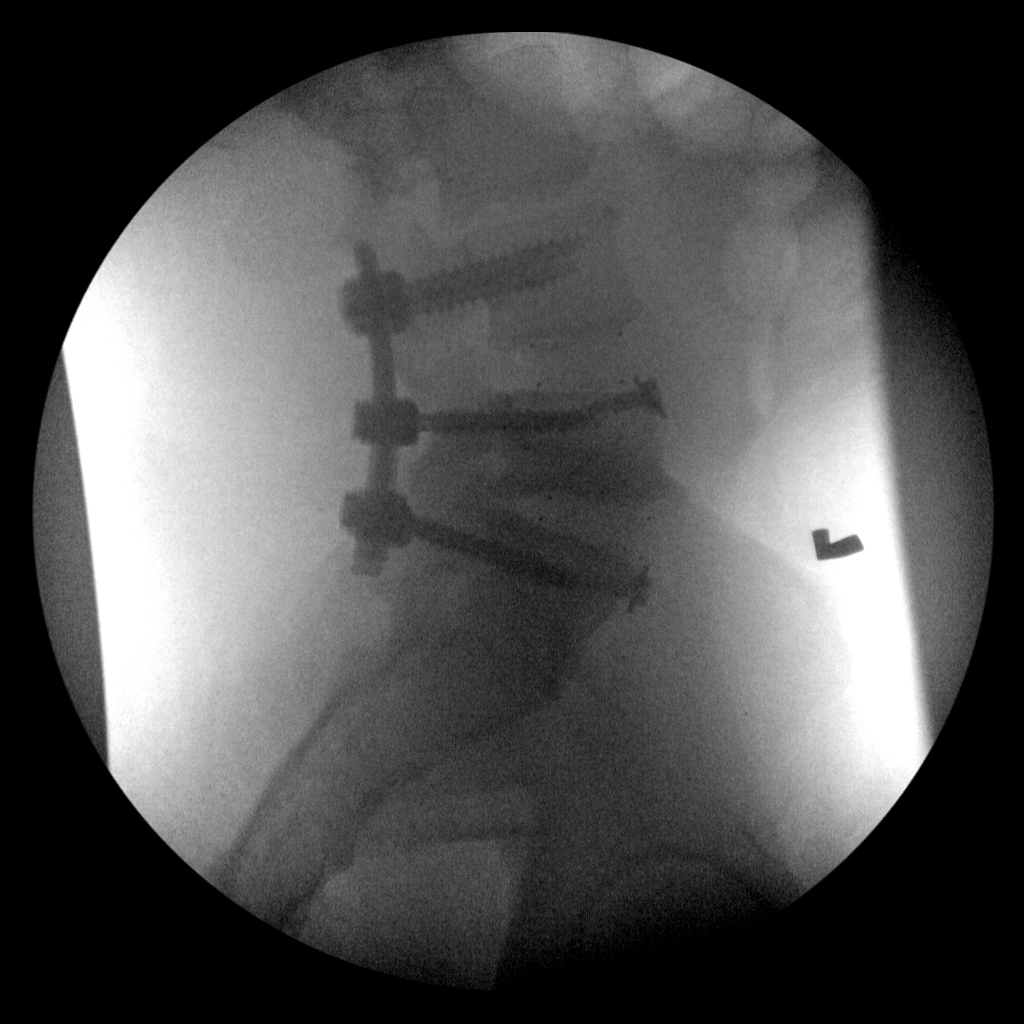

[2 of 2 positions shown; findings below may reference images not displayed]

FINDINGS: Changes of changes of anterior and posterior fusion at 2 levels in
the lower lumbar spine, reportedly at L4-5 and L5-S1. Transitional
anatomy at the lumbosacral junction. No visible hardware or bony
complicating feature.
IMPRESSION: Changes of 2 level fusion anteriorly and posteriorly. No visible
complicating feature.

## 2019-03-19 ENCOUNTER — Ambulatory Visit: Attending: Family | Primary: Student in an Organized Health Care Education/Training Program

## 2019-03-19 ENCOUNTER — Ambulatory Visit: Admit: 2019-03-19 | Attending: Family | Primary: Family Medicine

## 2019-03-19 DIAGNOSIS — I10 Essential (primary) hypertension: Secondary | ICD-10-CM

## 2019-03-19 MED ORDER — LISINOPRIL 20 MG TAB
20 mg | ORAL_TABLET | Freq: Every day | ORAL | 0 refills | Status: DC
Start: 2019-03-19 — End: 2019-10-08

## 2019-03-19 NOTE — Progress Notes (Signed)
PROGRESS NOTE    SUBJECTIVE:   Justin Barker is a 43 y.o. male seen for a follow up visit regarding Blood Pressure Check  Pt needs refill.   Stable.   No issues.   Has PCP follow up.    Past Medical History, Past Surgical History, Family history, Social History, and Medications were all reviewed with the patient today and updated as necessary.       Current Outpatient Medications   Medication Sig Dispense Refill   ??? metFORMIN ER (GLUCOPHAGE XR) 500 mg tablet Take 1,000 mg by mouth.     ??? lisinopriL (PRINIVIL, ZESTRIL) 20 mg tablet Take 1 Tab by mouth daily. 30 Tab 0   ??? metoprolol succinate (TOPROL-XL) 25 mg XL tablet TK 1 T PO  QD  6   ??? HYDROcodone-acetaminophen (NORCO) 10-325 mg tablet Take 1 Tab by mouth.     ??? diazePAM (VALIUM) 10 mg tablet Take 10 mg by mouth every six (6) hours as needed for Anxiety.       Allergies   Allergen Reactions   ??? Trazodone Unknown (comments)     Patient Active Problem List   Diagnosis Code   ??? Hypertension I10     Past Medical History:   Diagnosis Date   ??? Hypertension      Past Surgical History:   Procedure Laterality Date   ??? HX LUMBAR LAMINECTOMY      x4     Family History   Problem Relation Age of Onset   ??? Hypertension Mother    ??? Hypertension Father    ??? Diabetes Father    ??? Heart Disease Father      Social History     Tobacco Use   ??? Smoking status: Never Smoker   ??? Smokeless tobacco: Never Used   Substance Use Topics   ??? Alcohol use: Yes         Review of Systems   Eyes: Negative for visual disturbance.   Cardiovascular: Negative for chest pain, claudication, dyspnea on exertion, leg swelling, palpitations and syncope.   Respiratory: Negative for shortness of breath.    Gastrointestinal: Negative for abdominal pain.   Neurological: Negative for dizziness and light-headedness.         OBJECTIVE:  Visit Vitals  BP 130/80        Physical Exam  Constitutional:       Appearance: Normal appearance.   Cardiovascular:      Rate and Rhythm: Normal rate and regular rhythm.    Pulmonary:      Effort: Pulmonary effort is normal.      Breath sounds: Normal breath sounds.   Neurological:      Mental Status: He is alert.           ASSESSMENT and PLAN    Diagnoses and all orders for this visit:    1. Hypertension, unspecified type    Other orders  -     lisinopriL (PRINIVIL, ZESTRIL) 20 mg tablet; Take 1 Tab by mouth daily.        current treatment plan is effective, no change in therapy  lab results and schedule of future lab studies reviewed with patient  reviewed diet, exercise and weight control  copy of written low fat low cholesterol diet provided and reviewed  reviewed medications and side effects in detail  RTC as needed.

## 2019-03-19 NOTE — Progress Notes (Signed)
PROGRESS NOTE    SUBJECTIVE:   Justin Barker is a 43 y.o. male seen for a follow up visit regarding Blood Pressure Check  Pt needs refill.   Stable.   No issues.   Has PCP follow up.    Past Medical History, Past Surgical History, Family history, Social History, and Medications were all reviewed with the patient today and updated as necessary.       Current Outpatient Medications   Medication Sig Dispense Refill   ??? metFORMIN ER (GLUCOPHAGE XR) 500 mg tablet Take 1,000 mg by mouth.     ??? lisinopriL (PRINIVIL, ZESTRIL) 20 mg tablet Take 1 Tab by mouth daily. 30 Tab 0   ??? metoprolol succinate (TOPROL-XL) 25 mg XL tablet TK 1 T PO  QD  6   ??? HYDROcodone-acetaminophen (NORCO) 10-325 mg tablet Take 1 Tab by mouth.     ??? diazePAM (VALIUM) 10 mg tablet Take 10 mg by mouth every six (6) hours as needed for Anxiety.       Allergies   Allergen Reactions   ??? Trazodone Unknown (comments)     Patient Active Problem List   Diagnosis Code   ??? Hypertension I10     Past Medical History:   Diagnosis Date   ??? Hypertension      Past Surgical History:   Procedure Laterality Date   ??? HX LUMBAR LAMINECTOMY      x4     Family History   Problem Relation Age of Onset   ??? Hypertension Mother    ??? Hypertension Father    ??? Diabetes Father    ??? Heart Disease Father      Social History     Tobacco Use   ??? Smoking status: Never Smoker   ??? Smokeless tobacco: Never Used   Substance Use Topics   ??? Alcohol use: Yes         Review of Systems   Eyes: Negative for visual disturbance.   Cardiovascular: Negative for chest pain, claudication, dyspnea on exertion, leg swelling, palpitations and syncope.   Respiratory: Negative for shortness of breath.    Gastrointestinal: Negative for abdominal pain.   Neurological: Negative for dizziness and light-headedness.         OBJECTIVE:  Visit Vitals  BP 130/80        Physical Exam  Constitutional:       Appearance: Normal appearance.   Cardiovascular:      Rate and Rhythm: Normal rate and regular rhythm.    Pulmonary:      Effort: Pulmonary effort is normal.      Breath sounds: Normal breath sounds.   Neurological:      Mental Status: He is alert.           ASSESSMENT and PLAN    Diagnoses and all orders for this visit:    1. Hypertension, unspecified type    Other orders  -     lisinopriL (PRINIVIL, ZESTRIL) 20 mg tablet; Take 1 Tab by mouth daily.        current treatment plan is effective, no change in therapy  lab results and schedule of future lab studies reviewed with patient  reviewed diet, exercise and weight control  copy of written low fat low cholesterol diet provided and reviewed  reviewed medications and side effects in detail  RTC as needed.

## 2019-09-17 ENCOUNTER — Ambulatory Visit: Attending: Family | Primary: Student in an Organized Health Care Education/Training Program

## 2019-09-17 ENCOUNTER — Ambulatory Visit: Admit: 2019-09-17 | Payer: MEDICARE | Attending: Family | Primary: Family Medicine

## 2019-09-17 DIAGNOSIS — I10 Essential (primary) hypertension: Secondary | ICD-10-CM

## 2019-09-17 MED ORDER — LISINOPRIL-HYDROCHLOROTHIAZIDE 20 MG-12.5 MG TAB
ORAL_TABLET | Freq: Every day | ORAL | 2 refills | Status: DC
Start: 2019-09-17 — End: 2020-03-24

## 2019-09-17 NOTE — Progress Notes (Signed)
PROGRESS NOTE    SUBJECTIVE:   Justin Barker is a 43 y.o. male seen for a follow up visit regarding Medication Refill  The patient is a 43 y.o. male who is seen for follow-up of elevated blood pressure. He is exercising and is adherent to low salt diet.  Blood pressure is well controlled at home. Cardiac symptoms: none. Patient denieschest pain, dyspnea, syncope, fatigue Cardiovascular risk factors: none.    Past Medical History, Past Surgical History, Family history, Social History, and Medications were all reviewed with the patient today and updated as necessary.       Current Outpatient Medications   Medication Sig Dispense Refill   ??? lisinopril-hydroCHLOROthiazide (PRINZIDE, ZESTORETIC) 20-12.5 mg per tablet Take 1 Tablet by mouth daily. 30 Tablet 2   ??? lisinopriL (PRINIVIL, ZESTRIL) 20 mg tablet Take 1 Tab by mouth daily. 30 Tab 0   ??? metoprolol succinate (TOPROL-XL) 25 mg XL tablet TK 1 T PO  QD  6   ??? HYDROcodone-acetaminophen (NORCO) 10-325 mg tablet Take 1 Tab by mouth.     ??? diazePAM (VALIUM) 10 mg tablet Take 10 mg by mouth every six (6) hours as needed for Anxiety.       Allergies   Allergen Reactions   ??? Trazodone Unknown (comments)     Patient Active Problem List   Diagnosis Code   ??? Hypertension I10     Past Medical History:   Diagnosis Date   ??? Hypertension      Past Surgical History:   Procedure Laterality Date   ??? HX LUMBAR LAMINECTOMY      x4     Family History   Problem Relation Age of Onset   ??? Hypertension Mother    ??? Hypertension Father    ??? Diabetes Father    ??? Heart Disease Father      Social History     Tobacco Use   ??? Smoking status: Never Smoker   ??? Smokeless tobacco: Never Used   Substance Use Topics   ??? Alcohol use: Yes         Review of Systems   Eyes: Negative for visual disturbance.   Cardiovascular: Negative for chest pain, claudication, dyspnea on exertion, leg swelling, palpitations and syncope.   Respiratory: Negative for shortness of breath.    Gastrointestinal: Negative for  abdominal pain.   Neurological: Negative for dizziness and light-headedness.         OBJECTIVE:  Visit Vitals  BP 128/78        Physical Exam  Constitutional:       Appearance: Normal appearance.   Cardiovascular:      Rate and Rhythm: Normal rate and regular rhythm.   Pulmonary:      Effort: Pulmonary effort is normal.      Breath sounds: Normal breath sounds.   Neurological:      Mental Status: He is alert.           ASSESSMENT and PLAN    Diagnoses and all orders for this visit:    1. Hypertension, unspecified type    Other orders  -     lisinopril-hydroCHLOROthiazide (PRINZIDE, ZESTORETIC) 20-12.5 mg per tablet; Take 1 Tablet by mouth daily.        current treatment plan is effective, no change in therapy  reviewed diet, exercise and weight control  herbal and alternative therapies discussed with patient  RTC asneeded.

## 2019-09-17 NOTE — Progress Notes (Signed)
PROGRESS NOTE    SUBJECTIVE:   Justin Barker is a 44 y.o. male seen for a follow up visit regarding Medication Refill  The patient is a 44 y.o. male who is seen for follow-up of elevated blood pressure. He is exercising and is adherent to low salt diet.  Blood pressure is well controlled at home. Cardiac symptoms: none. Patient denieschest pain, dyspnea, syncope, fatigue Cardiovascular risk factors: none.    Past Medical History, Past Surgical History, Family history, Social History, and Medications were all reviewed with the patient today and updated as necessary.       Current Outpatient Medications   Medication Sig Dispense Refill   ??? lisinopril-hydroCHLOROthiazide (PRINZIDE, ZESTORETIC) 20-12.5 mg per tablet Take 1 Tablet by mouth daily. 30 Tablet 2   ??? lisinopriL (PRINIVIL, ZESTRIL) 20 mg tablet Take 1 Tab by mouth daily. 30 Tab 0   ??? metoprolol succinate (TOPROL-XL) 25 mg XL tablet TK 1 T PO  QD  6   ??? HYDROcodone-acetaminophen (NORCO) 10-325 mg tablet Take 1 Tab by mouth.     ??? diazePAM (VALIUM) 10 mg tablet Take 10 mg by mouth every six (6) hours as needed for Anxiety.       Allergies   Allergen Reactions   ??? Trazodone Unknown (comments)     Patient Active Problem List   Diagnosis Code   ??? Hypertension I10     Past Medical History:   Diagnosis Date   ??? Hypertension      Past Surgical History:   Procedure Laterality Date   ??? HX LUMBAR LAMINECTOMY      x4     Family History   Problem Relation Age of Onset   ??? Hypertension Mother    ??? Hypertension Father    ??? Diabetes Father    ??? Heart Disease Father      Social History     Tobacco Use   ??? Smoking status: Never Smoker   ??? Smokeless tobacco: Never Used   Substance Use Topics   ??? Alcohol use: Yes         Review of Systems   Eyes: Negative for visual disturbance.   Cardiovascular: Negative for chest pain, claudication, dyspnea on exertion, leg swelling, palpitations and syncope.   Respiratory: Negative for shortness of breath.    Gastrointestinal: Negative for  abdominal pain.   Neurological: Negative for dizziness and light-headedness.         OBJECTIVE:  Visit Vitals  BP 128/78        Physical Exam  Constitutional:       Appearance: Normal appearance.   Cardiovascular:      Rate and Rhythm: Normal rate and regular rhythm.   Pulmonary:      Effort: Pulmonary effort is normal.      Breath sounds: Normal breath sounds.   Neurological:      Mental Status: He is alert.           ASSESSMENT and PLAN    Diagnoses and all orders for this visit:    1. Hypertension, unspecified type    Other orders  -     lisinopril-hydroCHLOROthiazide (PRINZIDE, ZESTORETIC) 20-12.5 mg per tablet; Take 1 Tablet by mouth daily.        current treatment plan is effective, no change in therapy  reviewed diet, exercise and weight control  herbal and alternative therapies discussed with patient  RTC asneeded.

## 2019-10-08 ENCOUNTER — Ambulatory Visit: Attending: Family | Primary: Student in an Organized Health Care Education/Training Program

## 2019-10-08 ENCOUNTER — Ambulatory Visit: Admit: 2019-10-08 | Payer: MEDICARE | Attending: Family | Primary: Family Medicine

## 2019-10-08 DIAGNOSIS — I10 Essential (primary) hypertension: Secondary | ICD-10-CM

## 2019-10-08 MED ORDER — AMLODIPINE 10 MG TAB
10 mg | ORAL_TABLET | Freq: Every day | ORAL | 0 refills | Status: DC
Start: 2019-10-08 — End: 2020-03-24

## 2019-10-08 NOTE — Progress Notes (Signed)
PROGRESS NOTE    SUBJECTIVE:   Justin Barker is a 44 y.o. male seen for a follow up visit regarding Hypertension  The patient is a 44 y.o. male who is seen for follow-up of elevated blood pressure. He is exercising and is adherent to low salt diet.  Blood pressure is not well controlled at home. Cardiac symptoms: none. Patient denies chest pain, chest pressure/discomfort, dyspnea, palpitations, irregular heart beats, near-syncope.  Cardiovascular risk factors: obesity, male gender, hypertension.    Past Medical History, Past Surgical History, Family history, Social History, and Medications were all reviewed with the patient today and updated as necessary.       Current Outpatient Medications   Medication Sig Dispense Refill   ??? amLODIPine (NORVASC) 10 mg tablet Take 1 Tablet by mouth daily. 30 Tablet 0   ??? lisinopril-hydroCHLOROthiazide (PRINZIDE, ZESTORETIC) 20-12.5 mg per tablet Take 1 Tablet by mouth daily. 30 Tablet 2   ??? HYDROcodone-acetaminophen (NORCO) 10-325 mg tablet Take 1 Tab by mouth.     ??? diazePAM (VALIUM) 10 mg tablet Take 10 mg by mouth every six (6) hours as needed for Anxiety.       Allergies   Allergen Reactions   ??? Trazodone Unknown (comments)     Patient Active Problem List   Diagnosis Code   ??? Hypertension I10     Past Medical History:   Diagnosis Date   ??? Hypertension      Past Surgical History:   Procedure Laterality Date   ??? HX LUMBAR LAMINECTOMY      x4     Family History   Problem Relation Age of Onset   ??? Hypertension Mother    ??? Hypertension Father    ??? Diabetes Father    ??? Heart Disease Father      Social History     Tobacco Use   ??? Smoking status: Never Smoker   ??? Smokeless tobacco: Never Used   Substance Use Topics   ??? Alcohol use: Yes         Review of Systems   Eyes: Negative for visual disturbance.   Cardiovascular: Negative for chest pain, claudication, dyspnea on exertion, leg swelling, palpitations and syncope.   Respiratory: Negative for shortness of breath.     Gastrointestinal: Negative for abdominal pain.   Neurological: Negative for dizziness and light-headedness.         OBJECTIVE:  Visit Vitals  BP (!) 148/98        Physical Exam  Constitutional:       Appearance: Normal appearance.   Cardiovascular:      Rate and Rhythm: Normal rate and regular rhythm.   Pulmonary:      Effort: Pulmonary effort is normal.      Breath sounds: Normal breath sounds.   Neurological:      Mental Status: He is alert.           ASSESSMENT and PLAN    Diagnoses and all orders for this visit:    1. Hypertension, unspecified type    Other orders  -     amLODIPine (NORVASC) 10 mg tablet; Take 1 Tablet by mouth daily.        reviewed diet, exercise and weight control  the following changes are made - will add on norvasc.   Keep BP log, RTC in 2 weeks.

## 2020-03-24 ENCOUNTER — Ambulatory Visit: Attending: Family | Primary: Student in an Organized Health Care Education/Training Program

## 2020-03-24 ENCOUNTER — Ambulatory Visit: Admit: 2020-03-24 | Payer: MEDICARE | Attending: Family | Primary: Family Medicine

## 2020-03-24 DIAGNOSIS — I10 Essential (primary) hypertension: Secondary | ICD-10-CM

## 2020-03-24 MED ORDER — LISINOPRIL-HYDROCHLOROTHIAZIDE 20 MG-12.5 MG TAB
ORAL_TABLET | Freq: Every day | ORAL | 5 refills | Status: AC
Start: 2020-03-24 — End: ?

## 2020-03-24 MED ORDER — AMLODIPINE 10 MG TAB
10 mg | ORAL_TABLET | Freq: Every day | ORAL | 5 refills | Status: AC
Start: 2020-03-24 — End: ?

## 2020-03-24 NOTE — Progress Notes (Signed)
PROGRESS NOTE    SUBJECTIVE:   Justin Barker is a 44 y.o. male seen for a follow up visit regarding Hypertension  The patient is a 44 y.o. male who is seen for follow-up of elevated blood pressure. He is exercising and is adherent to low salt diet.  Blood pressure is well controlled at home. Cardiac symptoms: none. Patient denieschest pain, dyspnea, syncope, fatigue Cardiovascular risk factors: none.    Past Medical History, Past Surgical History, Family history, Social History, and Medications were all reviewed with the patient today and updated as necessary.       Current Outpatient Medications   Medication Sig Dispense Refill   ??? amLODIPine (NORVASC) 10 mg tablet Take 1 Tablet by mouth daily. 30 Tablet 5   ??? lisinopril-hydroCHLOROthiazide (PRINZIDE, ZESTORETIC) 20-12.5 mg per tablet Take 1 Tablet by mouth daily. 30 Tablet 5   ??? HYDROcodone-acetaminophen (NORCO) 10-325 mg tablet Take 1 Tab by mouth.     ??? diazePAM (VALIUM) 10 mg tablet Take 10 mg by mouth every six (6) hours as needed for Anxiety.       Allergies   Allergen Reactions   ??? Trazodone Unknown (comments)     Patient Active Problem List   Diagnosis Code   ??? Hypertension I10     Past Medical History:   Diagnosis Date   ??? Hypertension      Past Surgical History:   Procedure Laterality Date   ??? HX LUMBAR LAMINECTOMY      x4     Family History   Problem Relation Age of Onset   ??? Hypertension Mother    ??? Hypertension Father    ??? Diabetes Father    ??? Heart Disease Father      Social History     Tobacco Use   ??? Smoking status: Never Smoker   ??? Smokeless tobacco: Never Used   Substance Use Topics   ??? Alcohol use: Yes         Review of Systems   Eyes: Negative for visual disturbance.   Cardiovascular: Negative for chest pain, claudication, dyspnea on exertion, leg swelling, palpitations and syncope.   Respiratory: Negative for shortness of breath.    Gastrointestinal: Negative for abdominal pain.   Neurological: Negative for dizziness and light-headedness.          OBJECTIVE:  Visit Vitals  BP (!) 146/84        Physical Exam  Constitutional:       Appearance: Normal appearance.   Cardiovascular:      Rate and Rhythm: Normal rate and regular rhythm.   Pulmonary:      Effort: Pulmonary effort is normal.      Breath sounds: Normal breath sounds.   Neurological:      Mental Status: He is alert.           ASSESSMENT and PLAN    Diagnoses and all orders for this visit:    1. Hypertension, unspecified type    Other orders  -     amLODIPine (NORVASC) 10 mg tablet; Take 1 Tablet by mouth daily.  -     lisinopril-hydroCHLOROthiazide (PRINZIDE, ZESTORETIC) 20-12.5 mg per tablet; Take 1 Tablet by mouth daily.        current treatment plan is effective, no change in therapy  reviewed diet, exercise and weight control  herbal and alternative therapies discussed with patient  RTC asneeded.      Pt has only been take one bp will make sure  to be taking both now.   Keep BP log

## 2020-10-02 ENCOUNTER — Ambulatory Visit
Admit: 2020-10-02 | Discharge: 2020-10-02 | Payer: PRIVATE HEALTH INSURANCE | Attending: Pulmonary Disease | Primary: Student in an Organized Health Care Education/Training Program

## 2020-10-02 DIAGNOSIS — G4733 Obstructive sleep apnea (adult) (pediatric): Secondary | ICD-10-CM

## 2020-10-02 NOTE — Progress Notes (Signed)
[facility]  [address]  [place], [place] [id number]  [phone]    Patient Name:  Justin GreaserJarvis Harpster  Date of Birth:  1976-01-15      Office Visit 10/02/2020    CHIEF COMPLAINT:    Chief Complaint   Patient presents with   ??? Sleep Apnea   ??? New Patient       HISTORY OF PRESENT ILLNESS:      This is a very pleasant 45 year old gentleman seen in outpatient consultation at the request of Dr. Britt Bottomrantham for evaluation and management of sleep apnea.    The patient has a very strong family history of sleep apnea with both parents and 2 brothers having the disease.  He has also a history of morbid obesity and has been noted by his wife to have very loud snoring associated with witnessed apneas.  Because of this he underwent a split-night study back on 07/16/2020 which revealed evidence of severe obstructive sleep apnea with an AHI of 72.9 and desaturations as low as 63%.  He was started on 5 cmH2O and titrated up to a level of 13 cmH2O which eliminated all disordered breathing.  He never reached REM sleep at this level of CPAP so the actual optimal level is a bit unclear.  I will place him on an AutoSet device from 12-15 cmH2O to cover his disordered breathing we will try to lower his airway pressures a bit.  The patient felt dramatically better following the CPAP titration and I would expect him to have a similar result with APAP therapy.    The patient has noted has a history of morbid obesity.  His BMI is currently running 44.  We discussed the importance of weight loss in the management of sleep apnea since excessive weight is often a primary cause for the disease.  A low glycemic index diet is recommended and the rationale for limiting insulin production was reviewed.  Insulin blocks the initial chemical reaction needed to breakdown body fat and therefore keeping insulin levels low in the body is very important in allowing for weight loss.  I will provide him with our Wheat Belly diet handout  for his review.  Review of cookbooks associated with this diet is recommended help guide food choices long-term.  He is also encouraged to try to exercise as well as possible and be aggressive in his weight loss to prevent long-term complications, especially orthopedic, from his obesity.    The patient's Epworth score is currently running 6/24 which is normal.  He recently had blood work assessing his iron stores and everything was normal.            Sleep Medicine 10/02/2020   Sitting and reading 0   Watching TV 1   Sitting, inactive in a public place (e.g. a theatre or a meeting) 1   As a passenger in a car for an hour without a break 1   Lying down to rest in the afternoon when circumstances permit 3   Sitting and talking to someone 0   Sitting quietly after a lunch without alcohol 0   In a car, while stopped for a few minutes in traffic 0   Total score 6         Split night 07/16/20        Ref Range & Units 09/01/20 1528    TIBC 250 - 450 ug/dL [id number]    Unsaturated Iron Binding Capacity 111 - 343 ug/dL  235    Iron, Serum 38 - 169 ug/dL 45    Iron Saturation 15 - 55 % 16              Past Medical History:   Diagnosis Date   ??? Hypertension          @PROBEXP @      Past Surgical History:   Procedure Laterality Date   ??? LUMBAR LAMINECTOMY      x4       @PULMSTUDIES @    Social History     Socioeconomic History   ??? Marital status: Divorced     Spouse name: Not on file   ??? Number of children: Not on file   ??? Years of education: Not on file   ??? Highest education level: Not on file   Occupational History   ??? Not on file   Tobacco Use   ??? Smoking status: Never Smoker   ??? Smokeless tobacco: Never Used   Substance and Sexual Activity   ??? Alcohol use: Yes   ??? Drug use: Not on file   ??? Sexual activity: Not on file   Other Topics Concern   ??? Not on file   Social History Narrative   ??? Not on file     Social Determinants of Health     Financial Resource Strain:    ??? Difficulty of Paying Living Expenses: Not on file   Food Insecurity:     ??? Worried About Running Out of Food in the Last Year: Not on file   ??? Ran Out of Food in the Last Year: Not on file   Transportation Needs:    ??? Lack of Transportation (Medical): Not on file   ??? Lack of Transportation (Non-Medical): Not on file   Physical Activity:    ??? Days of Exercise per Week: Not on file   ??? Minutes of Exercise per Session: Not on file   Stress:    ??? Feeling of Stress : Not on file   Social Connections:    ??? Frequency of Communication with Friends and Family: Not on file   ??? Frequency of Social Gatherings with Friends and Family: Not on file   ??? Attends Religious Services: Not on file   ??? Active Member of Clubs or Organizations: Not on file   ??? Attends Meetings: Not on file   ??? Marital Status: Not on file   Intimate Partner Violence:    ??? Fear of Current or Ex-Partner: Not on file   ??? Emotionally Abused: Not on file   ??? Physically Abused: Not on file   ??? Sexually Abused: Not on file   Housing Stability:    ??? Unable to Pay for Housing in the Last Year: Not on file   ??? Number of Places Lived in the Last Year: Not on file   ??? Unstable Housing in the Last Year: Not on file         Family History   Problem Relation Age of Onset   ??? Heart Disease Father    ??? Diabetes Father    ??? Hypertension Father    ??? Hypertension Mother          Allergies   Allergen Reactions   ??? Trazodone And Nefazodone Other (See Comments)     Other reaction(s): Unknown-Unspecified  Prolong erection  Other reaction(s): Other (see comments), Unknown (comments), Unknown-Unspecified  Long lasting erection  Long lasting erection  Long lasting erection  Long lasting erection  PROLONGED ERECTION           Current Outpatient Medications   Medication Sig   ??? lisinopril (PRINIVIL;ZESTRIL) 40 MG tablet Take 40 mg by mouth daily     No current facility-administered medications for this visit.           REVIEW OF SYSTEMS:   CONSTITUTIONAL:   There is no history of fever, chills, night sweats, weight loss, weight gain,  persistent fatigue, or lethargy/hypersomnolence.   EYES:   Denies problems with eye pain, erythema, blurred vision, or visual field loss.   ENTM:   Denies history of tinnitus, epistaxis, sore throat, hoarseness, or dysphonia.   LYMPH:   Denies swollen glands.   CARDIAC:   No chest pain, pressure, discomfort, palpitations, orthopnea, murmurs, or edema.   GI:   No dysphagia, heartburn reflux, nausea/vomiting, diarrhea, abdominal pain, or bleeding.   GU:   Denies history of dysuria, hematuria, polyuria, or decreased urine output.   MS:   No history of myalgias, arthralgias, bone pain, or muscle cramps.   SKIN:   No history of rashes, jaundice, cyanosis, nodules, or ulcers.   ENDO:   Negative for heat or cold intolerance.  No history of DM.   PSYCH:   Negative for anxiety, depression, insomnia, hallucinations.   NEURO:   There is no history of AMS, persistent headache, decreased level of consciousness, seizures, or motor or sensory deficits.      PHYSICAL EXAM:    Vitals:    10/02/20 1309   BP: 120/88   Pulse: 93   Resp: 20   Temp: 97.1 ??F (36.2 ??C)   SpO2: 97%        GENERAL APPEARANCE:   The patient is overweight and in no respiratory distress.   HEENT:   PERRL.  Conjunctivae unremarkable.   Nasal mucosa is without epistaxis, exudate, or polyps.  Gums and dentition are unremarkable.  There is mild oropharyngeal narrowing.   NECK/LYMPHATIC:   Symmetrical with no elevation of jugular venous pulsation.  Trachea midline. No thyroid enlargement.  No cervical adenopathy.   LUNGS:   Normal respiratory effort with symmetrical lung expansion.   Breath sounds are clear bilaterally.   HEART:   There is a regular rate and rhythm.  No murmur, rub, or gallop.  There is no edema in the lower extremities.   ABDOMEN:   Soft and non-tender.  Bowel sounds are normal.     SKIN:   There are no rashes, cyanosis, jaundice, or ecchymosis present.   EXTREMITIES:   The extremities are unremarkable without clubbing, cyanosis, joint  inflammation, degenerative, or ischemic change.   MUSCULOSKELETAL:   There is no abnormal tone, muscle atrophy, or abnormal movement present.   NEURO:   The patient is alert and oriented to person, place, and time.  Memory appears intact and mood is normal.  No gross sensorimotor deficits are present.        ASSESSMENT:  (Medical Decision Making)      ICD-10-CM    1. OSA (obstructive sleep apnea)  G47.33 The patient has severe obstructive sleep apnea associated with very severe arterial hypoxemia.  Application of CPAP at 13 cmH2O was adequate to eliminate disordered breathing and maintain adequate oxygen saturations.  He did not have REM sleep at this level of CPAP and so the patient still may need a little higher pressure.  I will therefore treat him with APAP at 12 to-15 cmH2O to assure adequate control  of his disordered breathing.     2. Morbid obesity with BMI of 40.0-44.9, adult (HCC)  E66.01 As noted, the patient's BMI is 44.  He needs to aggressively lose weight through caloric restriction particular of simple carbohydrates as well as increased physical activity.  The Wheat Belly diet handout is provided and review of cookbooks associated with this diet on [employer] may be beneficial to help guide his food choices in the future.      Z68.41          PLAN:    Begin APAP at 12-15 cmH2O with EPR of 3.  A ResMed P 30 I mask will be ordered for him along with a chinstrap.    Weight loss as above.    Follow-up will be in 4 to 5 months or sooner if needed.        Orders Placed This Encounter   Procedures   ??? DME - DURABLE MEDICAL EQUIPMENT     [facility]  Phone: [address]  [place] [address]  Dept: [phone]      Patient Name: Roald Lukacs  DOB: [birth year]  Gender: male  Address: [address]  [place] [address]  Patient phone number: [phone] (home)       Primary Insurance: Payor: [employer] / Plan: [employer] / Product Type: *No  Product type* /   Subscriber ID: [id number] - (Commercial)      AMB Supply Order  Order Details     DME Location: [facility]   Order Date: 10/02/2020   Diagnoses of OSA (obstructive sleep apnea) and Morbid obesity with BMI of 40.0-44.9, adult (HCC) were pertinent to this visit.          (  X   )New Set-Up      machine   (     )[phone] CPAP Unit  (  x   )E0601 Auto CPAP Unit  (     )E0470 BiLevel Unit  (     )E0470 Auto BiLevel Unit  (     )E0471 ASV   (     )E0471 Bilevel ST    (     )E1390 Oxygen Concentrator         Length of need: 12 months    Pressure: 12-15  cmH20  EPR: 2     Starting Ramp Pressure:  6 cm H20  Ramp Time: min  20    Patient had a diagnostic Apnea Hypopnea Index (AHI) of :  72.9    *SUPPLIES* Replace all as needed, or per coverage guidelines     Masks Type:    (     ) A7030-Full Face Mask (1 per 3 mon)  (     ) A7031-Full Mask (1 per month) Interface/Cushion      (  x   ) A7034-Nasal Mask (1 per 3 mon) <<< Resmed P30i >>>  (     ) M0102- Nasal Mask (1 per month) Interface/Cushion  (  x   ) A7033-Pillow (2 per mon)  (  x   ) A7036-Chinstrap (1 per 6 mon)      _________________________________________________________________          Other Supplies:    (  X   )A7035-Headgear (1 per 6 mon)  ( X    )A4604-Heated Tubing (1 per 3 mon)  (  X   )V2536- Disposable Filter (2  per mon)  (   X  )E0562-Heated Humidifier (1 per year)     (  x   )A7036-Chinstrap (sometimes used with Full Face Mask) (1 per 6 mos)  (     )A7037-Tubing-without heat (1 per 3 mos)  ( X   )A7039-Non-Disposable Filter (1 per 6 mos)  (   x  )A7046-Water Chamber (1 per 6 mos)  (     )E0561-Humidifier non-heated (1 per 5 yrs)      Signed Date: 10/02/2020  Electronically Signed By: Debbra Riding, MD      No orders of the defined types were placed in this encounter.          Debbra Riding, MD  Electronically signed    Over 50% of today's office visit was spent in face to face time reviewing test results, prognosis,  importance of compliance, education about disease process, benefits of medications, instructions for management of acute flare-ups, and follow up plans.  Total face to face time spent with the patient and charting was 40 minutes.      Dictated using voice recognition software.  Proof read but unrecognized errors may exist.

## 2020-10-02 NOTE — Patient Instructions (Addendum)
The company who will be taking care of your CPAP Learning About Sleeping Well  What does sleeping well mean?  Sleeping well means getting enough sleep. How much sleep is enough varies among people.  The number of hours you sleep is not as important as how you feel when you wake up. If you do not feel refreshed, you probably need more sleep. Another sign of not getting enough sleep is feeling tired during the day.  The average total nightly sleep time is 7?? to 8 hours. Healthy adults may need a little more or a little less than this.  Why is getting enough sleep important?  Getting enough quality sleep is a basic part of good health. When your sleep suffers, your mood and your thoughts can suffer too. You may find yourself feeling more grumpy or stressed. Not getting enough sleep also can lead to serious problems, including injury, accidents, anxiety, and depression.  What might cause poor sleeping?  Many things can cause sleep problems, including:  ?? Stress. Stress can be caused by fear about a single event, such as giving a speech. Or you may have ongoing stress, such as worry about work or school.  ?? Depression, anxiety, and other mental or emotional conditions.  ?? Changes in your sleep habits or surroundings. This includes changes that happen where you sleep, such as noise, light, or sleeping in a different bed. It also includes changes in your sleep pattern, such as having jet lag or working a late shift.  ?? Health problems, such as pain, breathing problems, and restless legs syndrome.  ?? Lack of regular exercise.  How can you help yourself?  Here are some tips that may help you sleep more soundly and wake up feeling more refreshed.  Your sleeping area   ?? Use your bedroom only for sleeping and sex. A bit of light reading may help you fall asleep. But if it doesn't, do your reading elsewhere in the house. Don't watch TV in bed.  ?? Be sure your bed is big enough to stretch out comfortably, especially if you have  a sleep partner.  ?? Keep your bedroom quiet, dark, and cool. Use curtains, blinds, or a sleep mask to block out light. To block out noise, use earplugs, soothing music, or a "white noise" machine.  Your evening and bedtime routine   ?? Get regular exercise, but not within 3 to 4 hours before your bedtime.  ?? Create a relaxing bedtime routine. You might want to take a warm shower or bath, listen to soothing music, or drink a cup of noncaffeinated tea.  ?? Go to bed at the same time every night. And get up at the same time every morning, even if you feel tired.  What to avoid   ?? Limit caffeine (coffee, tea, caffeinated sodas) during the day, and don't have any for at least 4 to 6 hours before bedtime.  ?? Don't drink alcohol before bedtime. Alcohol can cause you to wake up more often during the night.  ?? Don't smoke or use tobacco, especially in the evening. Nicotine can keep you awake.  ?? Don't take naps during the day, especially close to bedtime.  ?? Don't lie in bed awake for too long. If you can't fall asleep, or if you wake up in the middle of the night and can't get back to sleep within 15 minutes or so, get out of bed and go to another room until you feel sleepy.  ??   Don't take medicine right before bed that may keep you awake or make you feel hyper or energized. Your doctor can tell you if your medicine may do this and if you can take it earlier in the day.  If you can't sleep   ?? Imagine yourself in a peaceful, pleasant scene. Focus on the details and feelings of being in a place that is relaxing.  ?? Get up and do a quiet or boring activity until you feel sleepy.  ?? Don't drink any liquids after 6 p.m. if you wake up often because you have to go to the bathroom.   Where can you learn more?   Go to http://www.healthwise.net/[facility]  Enter J942 in the search box to learn more about "Learning About Sleeping Well."   ?? 2006-2014 Healthwise, Incorporated. Care instructions adapted under license by [employer]  (which disclaims liability or warranty for this information). This care instruction is for use with your licensed healthcare professional. If you have questions about a medical condition or this instruction, always ask your healthcare professional. Healthwise, Incorporated disclaims any warranty or liability for your use of this information.  Content Version: 10.1.311062; Current as of: July 04, 2012              Sleep Apnea: After Your Visit  Your Care Instructions  Sleep apnea means that you frequently stop breathing for 10 seconds or longer during sleep. It can be mild to severe, based on the number of times an hour that you stop breathing or have slowed breathing.  Blocked or narrowed airways in your nose, mouth, or throat can cause sleep apnea. Your airway can become blocked when your throat muscles and tongue relax during sleep.  You can treat sleep apnea at home by making lifestyle changes. You also can use a CPAP breathing machine that keeps tissues in the throat from blocking your airway. Or your doctor may suggest that you use a breathing device while you sleep. It helps keep your airway open. This could be a device that you put in your mouth. Other examples include strips or disks that you use on your nose. In some cases, surgery may be needed to remove enlarged tissues in the throat.  Follow-up care is a key part of your treatment and safety. Be sure to make and go to all appointments, and call your doctor if you are having problems. It's also a good idea to know your test results and keep a list of the medicines you take.  How can you care for yourself at home?  ?? Lose weight, if needed. It may reduce the number of times you stop breathing or have slowed breathing.  ?? Sleep on your side. It may stop mild apnea. If you tend to roll onto your back, sew a pocket in the back of your pajama top. Put a tennis ball into the pocket, and stitch the pocket shut. This will help keep you from sleeping on your  back.  ?? Avoid alcohol and medicines such as sleeping pills and sedatives before bed.  ?? Do not smoke. Smoking can make sleep apnea worse. If you need help quitting, talk to your doctor about stop-smoking programs and medicines. These can increase your chances of quitting for good.  ?? Prop up the head of your bed 4 to 6 inches by putting bricks under the legs of the bed.  ?? Treat breathing problems, such as a stuffy nose, caused by a cold or allergies.  ?? Try   a continuous positive airway pressure (CPAP) breathing machine if your doctor recommends it. The machine keeps your airway open when you sleep.  ?? If CPAP does not work for you, ask your doctor if you can try other breathing machines. A bilevel positive airway pressure machine uses one type of air pressure for breathing in and another type for breathing out. Another device raises or lowers air pressure as needed while you breathe.  ?? Talk to your doctor if:  ?? Your nose feels dry or bleeds when you use one of these machines. You may need to increase moisture in the air. A humidifier may help.  ?? Your nose is runny or stuffy from using a breathing machine. Decongestants or a corticosteroid nasal spray may help.  ?? You are sleepy during the day and it gets in the way of the normal things you do. Do not drive when you are drowsy.  When should you call for help?  Watch closely for changes in your health, and be sure to contact your doctor if:  ?? You still have sleep apnea even though you have made lifestyle changes.  ?? You are thinking of trying a device such as CPAP.  ?? You are having problems using a CPAP or similar machine.   Where can you learn more?   Go to http://www.healthwise.net/[facility]  Enter J936 in the search box to learn more about "Sleep Apnea: After Your Visit."   ?? 2006-2014 Healthwise, Incorporated. Care instructions adapted under license by [place] (which disclaims liability or warranty for this information). This care instruction is for  use with your licensed healthcare professional. If you have questions about a medical condition or this instruction, always ask your healthcare professional. Healthwise, Incorporated disclaims any warranty or liability for your use of this information.  Content Version: 10.1.311062; Current as of: May 08, 2012                        Eating Healthy Foods: After Your Visit  Your Care Instructions  Eating healthy foods can help lower your risk for disease. Healthy food gives you energy and keeps your heart strong, your brain active, your muscles working, and your bones strong.  A healthy diet includes a variety of foods from the basic food groups: grains, vegetables, fruits, milk and milk products, and meat and beans. Some people may eat more of their favorite foods from only one food group and, as a result, miss getting the nutrients they need. So, it is important to pay attention not only to what you eat but also to what you are missing from your diet. You can eat a healthy, balanced diet by making a few small changes.  Follow-up care is a key part of your treatment and safety. Be sure to make and go to all appointments, and call your doctor if you are having problems. It???s also a good idea to know your test results and keep a list of the medicines you take.  How can you care for yourself at home?  Look at what you eat  ?? Keep a food diary for a week or two and record everything you eat or drink. Track the number of servings you eat from each food group.  ?? For a balanced diet every day, eat a variety of:  ?? 6 or more ounce-equivalents of grains, such as cereals, breads, crackers, rice, or pasta, every day. An ounce-equivalent is 1 slice of bread, 1 cup   of ready-to-eat cereal, or ?? cup of cooked rice, cooked pasta, or cooked cereal.  ?? 2?? cups of vegetables, especially:  ?? Dark-green vegetables such as broccoli and spinach.  ?? Orange vegetables such as carrots and sweet potatoes.  ?? Dry beans (such as pinto and  kidney beans) and peas (such as lentils).  ?? 2 cups of fresh, frozen, or canned fruit. A small apple or 1 banana or orange equals 1 cup.  ?? 3 cups of nonfat or low-fat milk, yogurt, or other milk products.  ?? 5?? ounces of meat and beans, such as chicken, fish, lean meat, beans, nuts, and seeds. One egg, 1 tablespoon of peanut butter, ?? ounce nuts or seeds, or ?? cup of cooked beans equals 1 ounce of meat.  ?? Learn how to read food labels for serving sizes and ingredients. Fast-food and convenience-food meals often contain few or no fruits or vegetables. Make sure you eat some fruits and vegetables to make the meal more nutritious.  ?? Look at your food diary. For each food group, add up what you have eaten and then divide the total by the number of days. This will give you an idea of how much you are eating from each food group. See if you can find some ways to change your diet to make it more healthy.  Start small  ?? Do not try to make dramatic changes to your diet all at once. You might feel that you are missing out on your favorite foods and then be more likely to fail.  ?? Start slowly, and gradually change your habits. Try some of the following:  ?? Use whole wheat bread instead of white bread.  ?? Use nonfat or low-fat milk instead of whole milk.  ?? Eat brown rice instead of white rice, and eat whole wheat pasta instead of white-flour pasta.  ?? Try low-fat cheeses and low-fat yogurt.  ?? Add more fruits and vegetables to meals and have them for snacks.  ?? Add lettuce, tomato, cucumber, and onion to sandwiches.  ?? Add fruit to yogurt and cereal.  Enjoy food  ?? You can still eat your favorite foods. You just may need to eat less of them. If your favorite foods are high in fat, salt, and sugar, limit how often you eat them, but do not cut them out entirely.  ?? Eat a wide variety of foods.  Make healthy choices when eating out  ?? The type of restaurant you choose can help you make healthy choices. Even fast-food chains  are now offering more low-fat or healthier choices on the menu.  ?? Choose smaller portions, or take half of your meal home.  ?? When eating out, try:  ?? A veggie pizza with a whole wheat crust or grilled chicken (instead of sausage or pepperoni).  ?? Pasta with roasted vegetables, grilled chicken, or marinara sauce instead of cream sauce.  ?? A vegetable wrap or grilled chicken wrap.  ?? Broiled or poached food instead of fried or breaded items.  Make healthy choices easy  ?? Buy packaged, prewashed, ready-to-eat fresh vegetables and fruits, such as baby carrots, salad mixes, and chopped or shredded broccoli and cauliflower.  ?? Buy packaged, presliced fruits, such as melon or pineapple.  ?? Choose 100% fruit or vegetable juice instead of soda. Limit juice intake to 4 to 6 oz (?? to ?? cup) a day.  ?? Blend low-fat yogurt, fruit juice, and canned or frozen fruit to make a   smoothie for breakfast or a snack.   Where can you learn more?   Go to [employer]  Enter T756 in the search box to learn more about "Eating Healthy Foods: After Your Visit."   ?? 2006-2014 Healthwise, Incorporated. Care instructions adapted under license by [employer] (which disclaims liability or warranty for this information). This care instruction is for use with your licensed healthcare professional. If you have questions about a medical condition or this instruction, always ask your healthcare professional. [place] any warranty or liability for your use of this information.  Content Version: 10.1.311062; Current as of: Sep 09, 2011                                    Learning About Physical Activity  What is physical activity?  Physical activity is any kind of activity that gets your body moving. Being active means allowing your body to "practice" breathing, stretching, and lifting. The more practice your body gets, the better it works.  The types of physical activity that can help you get fit and stay  healthy include:  ?? Aerobic or "cardio" activities that make your heart beat faster and make you breathe harder, such as brisk walking, riding a bike, or running. Aerobic activities strengthen your heart and lungs and build up your endurance.  ?? Strength training activities that make your muscles work against, or "resist," something, such as lifting weights or doing push-ups. These activities help tone and strengthen your muscles.  ?? Stretches that allow you to move your joints and muscles through their full range of motion. Stretching helps you be more flexible and avoid injury.  What are the benefits of physical activity?  Being active is one of the best things you can do to get fit and stay healthy. It helps you to:  ?? Feel stronger and have more energy to do all the things you like to do.  ?? Focus better at school or work and perform better in sports.  ?? Feel, think, and sleep better.  ?? Reach and stay at a healthy weight.  ?? Lose fat and build lean muscle.  ?? Lower your risk for serious health problems.  ?? Keep your bones, muscles, and joints strong.  Being fit lets you do more physical activity. And it lets you work out harder without as much effort.  How can you make physical activity part of your life?  Get at least 30 minutes of exercise on most days of the week. Walking is a good choice. You also may want to do other activities, such as running, swimming, cycling, or playing tennis or team sports.  Pick activities that you like--ones that make your heart beat faster, your muscles stronger, and your muscles and joints more flexible. If you find more than one thing you like doing, do them all. You don't have to do the same thing every day.  Get your heart pumping every day. Any activity that makes your heart beat faster and keeps it at that rate for a while counts.  Here are some great ways to get your heart beating faster:  ?? Go for a brisk walk, run, or bike ride.  ?? Go for a hike or swim.  ?? Go in-line  skating.  ?? Play a game of touch football, basketball, or soccer.  ?? Ride a bike.  ?? Play tennis or racquetball.  ?? Climb  stairs.  Even some household chores can be aerobic--just do them at a faster pace. Vacuuming, raking or mowing the lawn, sweeping the garage, and washing and waxing the car all can help get your heart rate up.  Strengthen your muscles during the week. You don't have to lift heavy weights or grow big, bulky muscles to get stronger. Doing a few simple activities that make your muscles work against, or "resist," something can help you get stronger.  For example, you can:  ?? Do push-ups or sit-ups, which use your own body weight as resistance.  ?? Lift weights or dumbbells or use stretch bands at home or in a gym or community center.  Stretch your muscles often. Stretching will help you as you become more active. It can help you stay flexible, loosen tight muscles, and avoid injury. It can also help improve your balance and posture and can be a great way to relax.  Be sure to stretch the muscles you'll be using when you work out. It???s best to warm your muscles slightly before you stretch them. Walk or do some other light aerobic activity for a few minutes, and then start stretching.  When you stretch your muscles:  ?? Do it slowly. Stretching is not about going fast or making sudden movements.  ?? Don't push or bounce during a stretch.  ?? Hold each stretch for at least 15 to 30 seconds, if you can. You should feel a stretch in the muscle, but not pain.  ?? Breathe out as you do the stretch. Then breathe in as you hold the stretch. Don't hold your breath.  If you're worried about how more activity might affect your health, have a checkup before you start. Follow any special advice your doctor gives you for getting a smart start.   Where can you learn more?   Go to [employer]  Enter [id number] in the search box to learn more about "Learning About Physical Activity."   ?? 2006-2014  Healthwise, Incorporated. Care instructions adapted under license by [employer] (which disclaims liability or warranty for this information). This care instruction is for use with your licensed healthcare professional. If you have questions about a medical condition or this instruction, always ask your healthcare professional. [place] any warranty or liability for your use of this information.  Content Version: 10.1.311062; Current as of: March 09, 2012                                          Learning About CPAP for Sleep Apnea  What is CPAP?     CPAP is a small machine that you use at home every night while you sleep. It increases air pressure in your throat to keep your airway open. When you have sleep apnea, this can help you sleep better so you feel much better. CPAP stands for "continuous positive airway pressure."  The CPAP machine will have one of the following:  ?? A mask that covers your nose and mouth  ?? Prongs that fit into your nose  ?? A mask that covers your nose only, the most common type. This type is called NCPAP. The N stands for "nasal."  Why is it done?  CPAP is usually the best treatment for obstructive sleep apnea. It is the first treatment choice and the most widely used. Your doctor may suggest CPAP if you have:  ??  Moderate to severe sleep apnea.  ?? Sleep apnea and coronary artery disease (CAD) or heart failure.  How does it help?  ?? CPAP can help you have more normal sleep, so you feel less sleepy and more alert during the daytime.  ?? CPAP may help keep heart failure or other heart problems from getting worse.  ?? CPAP may help lower your blood pressure.  ?? If you use CPAP, your bed partner may also sleep better because you are not snoring or restless.  What are the side effects?  Some people who use CPAP have:  ?? A dry or stuffy nose and a sore throat.  ?? Irritated skin on the face.  ?? Sore eyes.  ?? Bloating.  If you have any of these problems, work with your  doctor to fix them. Here are some things you can try:  ?? Be sure the mask or nasal prongs fit well.  ?? See if your doctor can adjust the pressure of your CPAP.  ?? If your nose is dry, try a humidifier.  ?? If your nose is runny or stuffy, try decongestant medicine or a steroid nasal spray. Be safe with medicines. Read and follow all instructions on the label. Do not use the medicine longer than the label says.  If these things do not help, you might try a different type of machine. Some machines have air pressure that adjusts on its own. Others have air pressures that are different when you breathe in than when you breathe out. This may reduce discomfort caused by too much pressure in your nose.   Where can you learn more?   Go to [email or web address]://www.healthwise.net/[email or web address]  Enter X266 in the search box to learn more about "Learning About CPAP for Sleep Apnea."   ?? 2006-2014 Healthwise, Incorporated. Care instructions adapted under license by [employer] (which disclaims liability or warranty for this information). This care instruction is for use with your licensed healthcare professional. If you have questions about a medical condition or this instruction, always ask your healthcare professional. Healthwise, Incorporated disclaims any warranty or liability for your use of this information.  Content Version: 10.1.311062; Current as of: May 18, 2012        [place] Diet Review and Beginner's Guide  The [place] Diet has been popular for over a decade.  It's a lower-carb diet that has been credited with producing rapid weight loss without hunger, all while promoting heart health.  On the other hand, it's also been criticized for being a restrictive "fad" diet.   This article provides a detailed review of the [facility] Diet, including its benefits, downsides, safety and sustainability.    What Is the [facility] Diet?  The [facility] Diet was created in the mid-1990s by Dr. Arnoldo LenisArthur Agatston, a [place]-based  cardiologist. His work in heart disease research led to the development of the Agatston score, which measures the amount of calcium in the coronary arteries.  According to published interviews, Dr. Ellery PlunkAgatston observed that patients on the Atkins Diet were losing weight and belly fat, while those on low-fat, high-carb diets were struggling to achieve results.  However, he was uncomfortable with the high amount of saturated fat allowed on the Atkins Diet, especially for people with heart disease. In addition, he didn't believe in restricting high-fiber foods with "good carbs," like fruit and whole grains.   Dr. Ellery PlunkAgatston wanted to create a diet that allowed overweight, diabetic and prediabetic individuals to easily lose weight and  reduce their risk of heart disease.  Therefore, he developed the [facility] Diet, which is rich in low-glycemic-index carbs, lean proteins and unsaturated fats.   After losing weight and belly fat when he tried the diet out on himself, he began prescribing it to his patients, who reported similar results.  Dr. Tiffany Kocher book The [facility] Diet was published in 2003 and became a bestseller around the world. An updated version called The [facility] Diet Supercharged was published in 2009 and also became a worldwide Geographical information systems officer.  Summary: The [facility] Diet is a lower-carb diet that emphasizes lean meats, unsaturated fats and low-glycemic-index carbs. It was created by cardiologist Dr. Arnoldo Lenis.     How Does the [facility] Diet Work?  The [facility] Diet has three different phases: two for weight loss and a third for weight maintenance.  Phase 1  Phase 1 lasts 14 days.  It's considered the strictest phase because it limits fruit, grains and other higher-carb foods in order to decrease blood sugar and insulin levels, stabilize hunger and reduce cravings.  Most people can expect to lose 8-13 pounds (3.5-6 kg) of body weight during this phase.  During phase 1, you consume three meals  per day composed of lean protein, non-starchy vegetables and small amounts of healthy fat and legumes.   In addition, you consume two mandatory snacks per day, preferably a combination of lean protein and vegetables.  Phase 2  This phase begins on day 15 and should be maintained for as many weeks as necessary to achieve your goal weight.  You can expect to lose 1-2 pounds (0.5-1 kg) per week during this phase, on average.  During phase 2, all foods from phase 1 are allowed, plus limited portions of fruit and "good carbs," such as whole grains and certain types of alcohol.  Phase 3  Once you achieve your target weight, you advance to phase three.  In this stage, although the phase-2 guidelines should be the basis for your lifestyle, occasional treats are allowed and no foods are truly off limits.  However, if you overindulge and start putting on weight, Dr. Ellery Plunk recommends returning to phase 1 for one to two weeks before returning to phase three.  In The [facility] Diet Supercharged, Dr. Ellery Plunk also recommends regular exercise and provides a three-phase fitness program to accompany the diet phases.  Summary: The [facility] Diet consists of three phases: a low-carb phase for rapid weight loss, a less restrictive phase for more gradual weight loss and a third phase for weight maintenance.     Phase 1: Foods to Include  Please note that the guidelines for all phases are from the book, The [employer]. The guidelines on the [facility] Diet website may be different.  Lean Protein  Although portions aren't limited, the diet recommends slowly consuming a small portion and returning for seconds if you are still hungry.  ??? Lean beef, pork, lamb, veal and game   ??? Skinless chicken and [place] breast   ??? Fish and shellfish   ??? [place] bacon and pepperoni   ??? Eggs and egg whites   ??? Soy-based meat substitutes   ??? Low-fat hard cheese, ricotta cheese and cottage cheese   ??? Buttermilk, low-fat milk, plain  or [place] yogurt, kefir and soy milk, limited to 2 cups (473 ml) daily  Non-Starchy Vegetables  Consume a minimum of 4 1/2 cups daily.  All vegetables are allowed except beets, carrots,  corn, turnips, yams, peas, white potatoes and most types of winter squash.  Legumes  Limit these to 1/3-1/2 cup per day, cooked, unless otherwise noted.  ??? Black beans, kidney beans, pinto beans, navy beans, garbanzo beans and other bean varieties   ??? Split peas and black-eyed peas   ??? Lentils   ??? Edamame and soybeans   ??? Hummus, limited to 1/4 cup  Nuts and Seeds  Limit these to 1 oz (28 grams) per day.  ??? Almonds, cashews, macadamias, pecans, pistachios, walnuts and other nuts   ??? Nut butters, limited to 2 tbsp   ??? Flaxseeds, chia seeds, sesame seeds, pumpkin seeds and other seeds  Oils and Fats  Limited to 2 tbsp of oil per day. Monounsaturated oils are encouraged.  ??? Monounsaturated oils, such as olive, canola, macadamia and avocado oils   ??? Vegetable and seed oils, such as corn, flaxseed, grapeseed, peanut, safflower, sesame and soybean oil  Alternative Fat Choices  Each serving is equivalent to 2 tbsp of healthy oils.  ??? Avocado, limited to 2/3 of one fruit   ??? Trans-fat-free margarine, limited to 2 tbsp   ??? Low-fat mayonnaise, limited to 2 tbsp   ??? Regular mayonnaise, limited to 1 tbsp   ??? Salad dressing with less than 3 grams sugar, limited to 2 tbsp   ??? Olives, limited to 20-30, depending on size  Sweet Treats  Limit consumption to 100 calories or fewer per day.  ??? Sugar-free or unsweetened cocoa or chocolate syrup   ??? Sugar-free gelatin, jams and jellies   ??? Sugar-free candies, popsicles or gum   ??? Sugar substitutes, including Stevia, artificial sweeteners and sugar alcohols like xylitol and erythritol  Condiments  You may eat unlimited quantities of these foods, unless otherwise noted.  ??? Broth   ??? Herbs, spices, horseradish, mustard, lemon juice or salsa   ??? All vinegars, with balsamic limited to 1 tbsp   ??? Light coconut  milk, limited 1/4 cup (59 ml)   ??? Soy sauce, steak sauce or miso, limited to 1 1/2 tsp (7 ml)   ??? Cream, whole milk or half and half, limited to 1 tbsp   ??? Light sour cream or cream cheese, limited to 2 tbsp   ??? Light whipped topping, limited to 2 tbsp  Beverages  You may drink unlimited quantities of these beverages, although drinking your caffeine in moderation is advised.  ??? Coffee, regular or decaffeinated   ??? Tea, regular, decaffeinated or herbal   ??? Sugar-free sodas   ??? Sugar-free drink mixes   ??? Tomato juice or vegetable juice  Phase 1: Foods to Avoid  Certain fatty foods and those high in carbs, including fruits and grains, are not allowed in phase 1. These include:   ??? Fatty meat and poultry   ??? Butter and coconut oil   ??? Whole milk   ??? Foods made with refined sugar   ??? Honey, maple syrup and agave nectar   ??? Grains   ??? All fruits and fruit juice   ??? Beets, carrots, corn, turnips, yams, peas, white potatoes and winter squash   ??? Alcohol    Phases 2 and 3: Foods to Include  Phase 2 includes all phase 1 foods and gradually adds in higher-carb foods, beginning with one daily serving of fruit and whole grains or starchy vegetables for the first week.  On the 14th day of phase 2 and thereafter, you may consume up to three servings of  fruit and four servings of whole grains and starchy vegetables per day.  An occasional alcoholic drink is also allowed, although choices are limited to light beer and dry wine.  Once you've achieved your goal weight, you move to phase three for maintenance. During this phase, you should generally follow the guidelines from phase 2.   However, you can include "treat" foods occasionally, since no foods are completely off limits.  Fruits  Consume 1-3 servings per day. All fresh and frozen fruits are allowed except dates, figs, pineapple, raisins and watermelon.  A serving size is one small piece of fruit, half a grapefruit or 3/4 cup (about 115 grams) berries, cherries or  grapes.  Whole Grains and Starchy Vegetables  Consume 1-4 servings per day.  Except where noted, one serving size is 1/2 cup cooked starchy vegetables, 1 slice bread or 1/2 cup cooked grains.  ??? Peas   ??? Rutabaga   ??? Sweet potatoes and yams   ??? Turnips   ??? Winter squash, limited to 3/4 cup   ??? Whole-grain hot cereal   ??? Whole-grain cold cereal, limited to 1 cup   ??? Whole-grain bread   ??? Manson Passey or wild rice   ??? Whole-grain pasta, quinoa, couscous or farro   ??? Taro, limited to 1/3 cup   ??? Popcorn, limited to 3 cups   ??? Whole-grain bagel, limited to 1/2 small   ??? Pita bread, limited to 1/2 pita   ??? Corn or whole-grain tortilla, limited to 1 small  Alcohol  One daily serving of dry wine or an occasional light beer is allowed.  ??? Light beer, limited to 12 oz (355 ml)   ??? Wine, dry red or white, limited to 4 oz (118 ml)  Phases 2 and 3: Foods to Avoid  Phase 2 of the [facility] Diet discourages intake of fatty meats, saturated fat and foods high in refined or natural sugar. Try to avoid:   ??? Fatty meat and poultry   ??? Butter and coconut oil   ??? Whole milk   ??? Foods made with refined flour or sugar   ??? Honey, maple syrup, agave nectar   ??? Fruit juice   ??? Beets, corn and white potatoes   ??? Dates, figs, pineapple, raisins and watermelon   ??? Alcohol other than light beer and dry wine  Sample Days on the Diet  Here are sample meal plans for phase 1 and phase 2 of the [facility] Diet, to give you a snapshot of what a typical day might look like.  Phase 1 Sample Day  ??? Breakfast: 3 eggs and 1 cup kale cooked with 1 tsp olive oil   ??? Snack: 1 oz (28 grams) string cheese with bell pepper slices   ??? Lunch: Roasted salmon and asparagus salad with mustard vinaigrette   ??? Snack: Celery sticks with 2 tsp peanut butter   ??? Dinner: [employer] with broccoli  Phase 2 Sample Day  ??? Breakfast: Quick and easy peanut butter oatmeal   ??? Snack: 1 cup cucumber slices with 1/4 cup hummus   ??? Lunch: Apple-walnut chicken salad   ??? Snack: Cottage  cheese with cherry tomatoes   ??? Dinner: Government social research officer with 1/3 cup guacamole  There are hundreds of recipes available for all three phases of the [facility] Diet, including many with ingredients that are cheap, tasty and easy to find.  Summary: You can find many recipes for the [facility] Diet, with the  sample days above indicating how the days might look.     Benefits of the [facility] Diet  There are several benefits of the [employer], including its ability to produce weight loss without hunger.  Research, including an analysis of 24 studies, has consistently shown that high-protein, low-carb diets are effective for weight loss (1, 2, 3, 4).  Part of this is due to protein's ability to increase your metabolic rate. In addition, protein helps modify hormone levels that reduce hunger and promote fullness, so you end up naturally eating less (5, 6, 7).  What's more, gradually adding small amounts of healthy carbs back into your diet may promote continued weight loss in some people and make it easier for them to stick to the diet long-term.  In one study, overweight and obese people with metabolic syndrome followed the [facility] Diet for 12 weeks (8).  By the end of the study, they had lost 11 pounds (5.2 kg) and 2 inches (5.1 cm) from around their waists, on average. They also experienced significant decreases in fasting insulin and an increase in the fullness hormone CCK.  The [facility] Diet encourages a high intake of fatty fish like salmon and other foods that fight inflammation, such as leafy greens and cruciferous veggies.  In addition, it recommends dieters regularly consume eggs, nuts, seeds, extra virgin olive oil and other foods that have been shown to protect heart health.  The book makes meal planning easy and pleasurable by providing two weeks of sample menus and recipes for each phase. There are also hundreds of recipes available online for phase 1 and phase 2 meals.  Summary: The [facility]  Diet may help you lose weight and belly fat, reduce insulin levels, increase hormone levels that promote fullness and help protect heart health.      supplies is:    Address: [address], [place], [place] [address]  Phone: [phone] Option #1  Fax: [phone]      WHAT IS THE WHEAT BELLY DIET?    Wheat Belly: Lose the Wheat, Lose the Weight, and Find Your Path Back to Health is the book by the renowned cardiologist, Dr. Elsie Saas, which explains how eliminating wheat from our diets can result in numerous health benefits, including weight loss.    Wheat Belly is really about two things:     1) eliminating wheat (and other gluten-containing grains such as barley and rye), and   2) managing carbohydrates/sugars to individual tolerance levels to manage blood sugar and promote weight-loss       The theory is that wheat promotes high blood sugar which though a series of reactions, causes the body to accumulate more visceral fat.        The Plan   Wheat isn't the only bad boy in this diet. Many other foods are either eliminated or eaten in limited quantities such as fruit, starchy veggies, whole grains, legumes, dried fruit, corn starch and corn meal. Three meals a day are encouraged without any snacks.  So what can you eat? Here???s the basic list:     In unlimited quantities:   Veggies (except potatoes and corn)   Raw nuts and seeds   Oils   Meats and eggs   Cheese   Non-sugary condiments (like mustard and salsa)   Ground flaxseed   Avocados, olives, coconut   Spices   Unsweetened cocoa        In limited quantities:  Dairy (except cheese)   Fruit   Whole corn (like on the cob)   Fruit juice   Non-wheat, non-gluten grains (like quinoa, amaranth, and rice)   Beans, peas, and lentils   Soy products (fermented soy products preferred)  Unlike other diet plans, there is basically one phase: toss out all your wheat foods and go cold [place]. Once you go wheat free, the author claims that the pounds will come right  off and you???ll immediately feel better. Fasting, defined as drinking water only, is highly recommended for several days or even weeks at a time.        How to Eat a Wheat Belly Diet in a Healthy Way  Best Wheat Belly Foods to Eat:  All varieties of fresh vegetables, especially those that are non-starchy and low in calories. These include things like cruciferous veggies (broccoli or Brussels sprouts, for example), leafy greens, peppers, mushrooms, asparagus, artichoke, etc.   Fresh fruit (but not processed juices), including berries, apples, melon, and citrus fruits like grapefruit or oranges. Some people prefer to eat mostly low-sugar fruits but avoid those higher in sugar like pineapple, papaya, mango or banana.   Healthy fats like coconut oil or olive oil, raw nuts and seeds, avocado, coconut milk, olives, cocoa butter, and grass-fed butter or ghee.   Grass-fed, humanely raised meat and eggs, plus wild-caught fish.   Full-fat cheeses (ideally made from raw, organic milk).   Fermented foods like unsweetened kefir or yogurt, pickled or cultured vegetables, and in moderation tofu, tempeh, miso and natto.   If they???re well-tolerated, unprocessed grains in moderation, including quinoa, millet, buckwheat (not actually a type of wheat), brown rice and amaranth.               Worst Wheat Belly Foods to Avoid:    Eating a wheat belly diet means avoiding anything made with the grains wheat, barley, rye, spelt or certain oats. Additionally, Earlene Plater recommends avoiding added sugar, condiments that include synthetic or chemically altered ingredients, sugary drinks and other processed foods as much as possible. Below are the main foods to exclude from your diet if you choose to try following this dietary plan:  Grain-based desserts, including both packaged or homemade cakes, cookies, donuts, pies, crisps, cobblers, and granola bars   Breads, especially those made with refined wheat flour. Even many ???gluten-free breads??? or  packaged products should not contribute many calories to your diet. While products made from grains other than wheat (like corn or rice) might be free of gluten, they???re still usually not very nutrient-dense and are inferior to eating whole, sprouted ancient grains like oats, quinoa, wild rice or teff, for example. Plus, modern food-processing techniques usually contaminate these foods with gluten since they???re processed using the same equipment that wheat is.   Most cereals   Pizza   Pasta and noodles   Chips and crackers   Wheat tortillas, wraps, burritos and tacos   Fast food   Take-out, including most [place] or [place] dishes, burgers and deli sandwiches   Breaded proteins like chicken cutlets, processed meats, hot dogs and frozen veggie burgers   Added sugar, including high fructose corn syrup, sucrose, dried fruit, juices and sugary beverage   Processed rice and potato products   Trans fats, fried foods and cured meats  Tips for Giving Up Wheat and Processed Grains:  When grocery shopping, check ingredients carefully and look for products made without wheat, rye and barley. This might mean choosing certified ???gluten-free??? items  in some cases, although even these can be highly processed. The most substantial sources of wheat in your diet are likely bread or baked products made with wheat flour (like pizza, pasta at restaurants, bread, etc.), so unless specifically noted that these are grain- or gluten-free, assume they contain wheat.   If you are going to buy bread, look for sourdough or sprouted grain breads (like Ezekiel bread), which are usually better tolerated than ordinary wheat-flour breads.   When it comes to baking or using flour in recipes, try some of these naturally gluten-free flour alternatives over wheat flour: brown rice, quinoa, chickpea, almond and coconut flour.   Remember that wheat is hiding in many condiments, sauces, dressings, etc. Avoid any that contain flour or added sugar, sticking  with basic condiments or flavor enhancers like vinegar, herbs, spices and real bone broth.   Many types of alcohol, including beer, also contain wheat. Hard liquor and wine are better options, however watch the amount you consume and what you mix these with.

## 2021-03-02 ENCOUNTER — Encounter
Payer: PRIVATE HEALTH INSURANCE | Attending: Pulmonary Disease | Primary: Student in an Organized Health Care Education/Training Program

## 2021-03-02 NOTE — Progress Notes (Deleted)
[facility]  [address]  [place], [place] [address]  [phone]    Patient Name:  Justin Barker  Date of Birth:  04-13-1976      Office Visit 03/01/2021    CHIEF COMPLAINT:    No chief complaint on file.      HISTORY OF PRESENT ILLNESS:      This is a very pleasant 45 year old gentleman seen in outpatient consultation at the request of Dr. Britt Bottom for evaluation and management of sleep apnea.    The patient has a very strong family history of sleep apnea with both parents and 2 brothers having the disease.  He has also a history of morbid obesity and has been noted by his wife to have very loud snoring associated with witnessed apneas.  Because of this he underwent a split-night study back on 07/16/2020 which revealed evidence of severe obstructive sleep apnea with an AHI of 72.9 and desaturations as low as 63%.  He was started on 5 cmH2O and titrated up to a level of 13 cmH2O which eliminated all disordered breathing.  He never reached REM sleep at this level of CPAP so the actual optimal level is a bit unclear.  I will place him on an AutoSet device from 12-15 cmH2O to cover his disordered breathing we will try to lower his airway pressures a bit.  The patient felt dramatically better following the CPAP titration and I would expect him to have a similar result with APAP therapy.    The patient has noted has a history of morbid obesity.  His BMI is currently running 44.  We discussed the importance of weight loss in the management of sleep apnea since excessive weight is often a primary cause for the disease.  A low glycemic index diet is recommended and the rationale for limiting insulin production was reviewed.  Insulin blocks the initial chemical reaction needed to breakdown body fat and therefore keeping insulin levels low in the body is very important in allowing for weight loss.  I will provide him with our Wheat Belly diet handout for his review.  Review of cookbooks associated  with this diet is recommended help guide food choices long-term.  He is also encouraged to try to exercise as well as possible and be aggressive in his weight loss to prevent long-term complications, especially orthopedic, from his obesity.    The patient's Epworth score is currently running 6/24 which is normal.  He recently had blood work assessing his iron stores and everything was normal.                          Ref Range & Units 09/01/20 1528    TIBC 250 - 450 ug/dL 914    Unsaturated Iron Binding Capacity 111 - 343 ug/dL 782    Iron, Serum 38 - 169 ug/dL 45    Iron Saturation 15 - 55 % 16              Past Medical History:   Diagnosis Date    Hypertension          @PROBEXP @      Past Surgical History:   Procedure Laterality Date    LUMBAR LAMINECTOMY      x4       @PULMSTUDIES @    Social History     Socioeconomic History    Marital status: Divorced     Spouse name: Not on  file    Number of children: Not on file    Years of education: Not on file    Highest education level: Not on file   Occupational History    Not on file   Tobacco Use    Smoking status: Never    Smokeless tobacco: Never   Substance and Sexual Activity    Alcohol use: Yes    Drug use: Not on file    Sexual activity: Not on file   Other Topics Concern    Not on file   Social History Narrative    Not on file     Social Determinants of Health     Financial Resource Strain: Not on file   Food Insecurity: Not on file   Transportation Needs: Not on file   Physical Activity: Not on file   Stress: Not on file   Social Connections: Not on file   Intimate Partner Violence: Not on file   Housing Stability: Not on file         Family History   Problem Relation Age of Onset    Heart Disease Father     Diabetes Father     Hypertension Father     Hypertension Mother          Allergies   Allergen Reactions    Trazodone And Nefazodone Other (See Comments)     Other reaction(s): Unknown-Unspecified  Prolong erection  Other reaction(s): Other (see comments),  Unknown (comments), Unknown-Unspecified  Long lasting erection  Long lasting erection  Long lasting erection  Long lasting erection  PROLONGED ERECTION           Current Outpatient Medications   Medication Sig    lisinopril (PRINIVIL;ZESTRIL) 40 MG tablet Take 40 mg by mouth daily     No current facility-administered medications for this visit.           REVIEW OF SYSTEMS:   CONSTITUTIONAL:   There is no history of fever, chills, night sweats, weight loss, weight gain, persistent fatigue, or lethargy/hypersomnolence.   EYES:   Denies problems with eye pain, erythema, blurred vision, or visual field loss.   ENTM:   Denies history of tinnitus, epistaxis, sore throat, hoarseness, or dysphonia.   LYMPH:   Denies swollen glands.   CARDIAC:   No chest pain, pressure, discomfort, palpitations, orthopnea, murmurs, or edema.   GI:   No dysphagia, heartburn reflux, nausea/vomiting, diarrhea, abdominal pain, or bleeding.   GU:   Denies history of dysuria, hematuria, polyuria, or decreased urine output.   MS:   No history of myalgias, arthralgias, bone pain, or muscle cramps.   SKIN:   No history of rashes, jaundice, cyanosis, nodules, or ulcers.   ENDO:   Negative for heat or cold intolerance.  No history of DM.   PSYCH:   Negative for anxiety, depression, insomnia, hallucinations.   NEURO:   There is no history of AMS, persistent headache, decreased level of consciousness, seizures, or motor or sensory deficits.      PHYSICAL EXAM:    There were no vitals filed for this visit.       GENERAL APPEARANCE:   The patient is overweight and in no respiratory distress.   HEENT:   PERRL.  Conjunctivae unremarkable.   Nasal mucosa is without epistaxis, exudate, or polyps.  Gums and dentition are unremarkable.  There is mild oropharyngeal narrowing.   NECK/LYMPHATIC:   Symmetrical with no elevation of jugular venous pulsation.  Trachea midline. No  thyroid enlargement.  No cervical adenopathy.   LUNGS:   Normal respiratory effort with  symmetrical lung expansion.   Breath sounds are clear bilaterally.   HEART:   There is a regular rate and rhythm.  No murmur, rub, or gallop.  There is no edema in the lower extremities.   ABDOMEN:   Soft and non-tender.  Bowel sounds are normal.     SKIN:   There are no rashes, cyanosis, jaundice, or ecchymosis present.   EXTREMITIES:   The extremities are unremarkable without clubbing, cyanosis, joint inflammation, degenerative, or ischemic change.   MUSCULOSKELETAL:   There is no abnormal tone, muscle atrophy, or abnormal movement present.   NEURO:   The patient is alert and oriented to person, place, and time.  Memory appears intact and mood is normal.  No gross sensorimotor deficits are present.        ASSESSMENT:  (Medical Decision Making)      ICD-10-CM          2.                 PLAN:    Begin APAP at 12-15 cmH2O with EPR of 3.  A ResMed P 30 I mask will be ordered for him along with a chinstrap.    Weight loss as above.    Follow-up will be in 4 to 5 months or sooner if needed.        No orders of the defined types were placed in this encounter.     No orders of the defined types were placed in this encounter.          Rudene Anda, MA  Electronically signed    Over 50% of today's office visit was spent in face to face time reviewing test results, prognosis, importance of compliance, education about disease process, benefits of medications, instructions for management of acute flare-ups, and follow up plans.  Total face to face time spent with the patient and charting was 40 minutes.      Dictated using voice recognition software.  Proof read but unrecognized errors may exist.
